# Patient Record
Sex: Male | Born: 1965 | Race: White | Hispanic: No | Marital: Married | Smoking: Never smoker
Health system: Southern US, Community
[De-identification: ages and names within clinical notes are randomized; demographics above are authoritative.]

## PROBLEM LIST (undated history)

## (undated) DIAGNOSIS — Z9889 Other specified postprocedural states: Secondary | ICD-10-CM

## (undated) DIAGNOSIS — T7840XA Allergy, unspecified, initial encounter: Secondary | ICD-10-CM

## (undated) DIAGNOSIS — F1910 Other psychoactive substance abuse, uncomplicated: Secondary | ICD-10-CM

## (undated) DIAGNOSIS — E785 Hyperlipidemia, unspecified: Secondary | ICD-10-CM

## (undated) DIAGNOSIS — R112 Nausea with vomiting, unspecified: Secondary | ICD-10-CM

## (undated) DIAGNOSIS — K219 Gastro-esophageal reflux disease without esophagitis: Secondary | ICD-10-CM

## (undated) DIAGNOSIS — E119 Type 2 diabetes mellitus without complications: Secondary | ICD-10-CM

## (undated) DIAGNOSIS — M1990 Unspecified osteoarthritis, unspecified site: Secondary | ICD-10-CM

## (undated) HISTORY — DX: Type 2 diabetes mellitus without complications: E11.9

## (undated) HISTORY — PX: HERNIA REPAIR: SHX51

## (undated) HISTORY — DX: Hyperlipidemia, unspecified: E78.5

## (undated) HISTORY — PX: POLYPECTOMY: SHX149

## (undated) HISTORY — DX: Other psychoactive substance abuse, uncomplicated: F19.10

## (undated) HISTORY — PX: TONSILLECTOMY: SUR1361

## (undated) HISTORY — PX: COLONOSCOPY: SHX174

## (undated) HISTORY — DX: Allergy, unspecified, initial encounter: T78.40XA

---

## 2007-11-12 ENCOUNTER — Emergency Department: Payer: Self-pay | Admitting: Emergency Medicine

## 2008-02-05 ENCOUNTER — Ambulatory Visit: Payer: Self-pay | Admitting: Family Medicine

## 2008-02-05 DIAGNOSIS — J4520 Mild intermittent asthma, uncomplicated: Secondary | ICD-10-CM

## 2008-02-05 DIAGNOSIS — J309 Allergic rhinitis, unspecified: Secondary | ICD-10-CM | POA: Insufficient documentation

## 2008-02-05 DIAGNOSIS — F1411 Cocaine abuse, in remission: Secondary | ICD-10-CM

## 2008-02-06 ENCOUNTER — Ambulatory Visit: Payer: Self-pay | Admitting: Family Medicine

## 2008-02-06 DIAGNOSIS — E781 Pure hyperglyceridemia: Secondary | ICD-10-CM

## 2008-02-06 DIAGNOSIS — E119 Type 2 diabetes mellitus without complications: Secondary | ICD-10-CM

## 2008-02-06 LAB — CONVERTED CEMR LAB
ALT: 17 units/L (ref 0–53)
CO2: 28 meq/L (ref 19–32)
Calcium: 9 mg/dL (ref 8.4–10.5)
Creatinine, Ser: 0.8 mg/dL (ref 0.4–1.5)
GFR calc Af Amer: 136 mL/min
Glucose, Bld: 108 mg/dL — ABNORMAL HIGH (ref 70–99)
HDL: 33.2 mg/dL — ABNORMAL LOW (ref >39.0)
Total Bilirubin: 0.9 mg/dL (ref 0.3–1.2)
Total CHOL/HDL Ratio: 5.9
Total Protein: 6.9 g/dL (ref 6.0–8.3)
Triglycerides: 185 mg/dL — ABNORMAL HIGH (ref 0–149)
VLDL: 37 mg/dL (ref 0–40)

## 2008-05-28 ENCOUNTER — Ambulatory Visit: Payer: Self-pay | Admitting: Family Medicine

## 2008-05-29 LAB — CONVERTED CEMR LAB
Cholesterol: 246 mg/dL (ref 0–200)
Triglycerides: 220 mg/dL (ref 0–149)

## 2008-09-28 ENCOUNTER — Ambulatory Visit: Payer: Self-pay | Admitting: Family Medicine

## 2008-09-29 LAB — CONVERTED CEMR LAB
Direct LDL: 127.8 mg/dL
HDL: 30.4 mg/dL — ABNORMAL LOW (ref >39.0)
Total CHOL/HDL Ratio: 8
Triglycerides: 303 mg/dL (ref 0–149)

## 2009-01-01 ENCOUNTER — Ambulatory Visit: Payer: Self-pay | Admitting: Family Medicine

## 2009-01-01 LAB — CONVERTED CEMR LAB
AST: 23 units/L (ref 0–37)
Direct LDL: 133.1 mg/dL

## 2009-09-06 ENCOUNTER — Encounter (INDEPENDENT_AMBULATORY_CARE_PROVIDER_SITE_OTHER): Payer: Self-pay | Admitting: *Deleted

## 2009-09-06 ENCOUNTER — Ambulatory Visit: Payer: Self-pay | Admitting: Family Medicine

## 2009-09-06 DIAGNOSIS — K5289 Other specified noninfective gastroenteritis and colitis: Secondary | ICD-10-CM

## 2009-09-06 DIAGNOSIS — E860 Dehydration: Secondary | ICD-10-CM

## 2010-02-25 ENCOUNTER — Telehealth (INDEPENDENT_AMBULATORY_CARE_PROVIDER_SITE_OTHER): Payer: Self-pay | Admitting: *Deleted

## 2010-02-28 ENCOUNTER — Ambulatory Visit: Payer: Self-pay | Admitting: Family Medicine

## 2010-03-04 ENCOUNTER — Ambulatory Visit: Payer: Self-pay | Admitting: Family Medicine

## 2010-03-04 LAB — CONVERTED CEMR LAB
AST: 23 units/L (ref 0–37)
Albumin: 4.2 g/dL (ref 3.5–5.2)
BUN: 15 mg/dL (ref 6–23)
Calcium: 9.5 mg/dL (ref 8.4–10.5)
Cholesterol: 262 mg/dL — ABNORMAL HIGH (ref 0–200)
Direct LDL: 81.9 mg/dL
GFR calc non Af Amer: 101.75 mL/min (ref >60)
Glucose, Bld: 115 mg/dL — ABNORMAL HIGH (ref 70–99)
HDL: 30.9 mg/dL — ABNORMAL LOW (ref >39.00)
Triglycerides: 1181 mg/dL — ABNORMAL HIGH (ref 0.0–149.0)

## 2010-05-31 ENCOUNTER — Ambulatory Visit: Payer: Self-pay | Admitting: Family Medicine

## 2010-06-05 DIAGNOSIS — R740 Nonspecific elevation of levels of transaminase and lactic acid dehydrogenase [LDH]: Secondary | ICD-10-CM

## 2010-06-05 DIAGNOSIS — R7402 Elevation of levels of lactic acid dehydrogenase (LDH): Secondary | ICD-10-CM | POA: Insufficient documentation

## 2010-06-05 LAB — CONVERTED CEMR LAB
ALT: 57 units/L — ABNORMAL HIGH (ref 0–53)
AST: 30 units/L (ref 0–37)
Albumin: 4.2 g/dL (ref 3.5–5.2)
BUN: 20 mg/dL (ref 6–23)
GFR calc non Af Amer: 69.87 mL/min (ref >60)
Glucose, Bld: 122 mg/dL — ABNORMAL HIGH (ref 70–99)
HDL: 39.7 mg/dL (ref >39.00)
PSA: 0.9 ng/mL (ref 0.10–4.00)
Potassium: 4.2 meq/L (ref 3.5–5.1)
Total Protein: 6.7 g/dL (ref 6.0–8.3)

## 2010-06-20 ENCOUNTER — Ambulatory Visit: Payer: Self-pay | Admitting: Family Medicine

## 2010-06-22 LAB — CONVERTED CEMR LAB
Albumin: 4.3 g/dL (ref 3.5–5.2)
Bilirubin, Direct: 0.1 mg/dL (ref 0.0–0.3)
Hep A IgM: NEGATIVE
Hep B C IgM: NEGATIVE
Hepatitis B Surface Ag: NEGATIVE
Total Protein: 6.8 g/dL (ref 6.0–8.3)

## 2010-06-27 ENCOUNTER — Encounter: Payer: Self-pay | Admitting: Family Medicine

## 2010-06-27 ENCOUNTER — Ambulatory Visit: Payer: Self-pay | Admitting: Family Medicine

## 2010-07-26 ENCOUNTER — Ambulatory Visit
Admission: RE | Admit: 2010-07-26 | Discharge: 2010-07-26 | Payer: Self-pay | Source: Home / Self Care | Attending: Family Medicine | Admitting: Family Medicine

## 2010-07-26 DIAGNOSIS — M79609 Pain in unspecified limb: Secondary | ICD-10-CM | POA: Insufficient documentation

## 2010-08-16 NOTE — Assessment & Plan Note (Signed)
Summary: feeling dehydrated/ alc   Vital Signs:  Patient profile:   45 year old male Height:      70 inches Weight:      181.6 pounds BMI:     26.15 Temp:     97.9 degrees F oral Pulse rate:   92 / minute Pulse rhythm:   regular BP sitting:   120 / 70  (left arm) Cuff size:   regular  Vitals Entered By: Benny Lennert CMA Duncan Dull) (September 06, 2009 4:25 PM)  History of Present Illness: Chief complaint diarrhea since thursday started feeling really weak last night  Acute Visit History:      The patient complains of abdominal pain, diarrhea, and nausea.  These symptoms began 5 days ago.  He denies constipation, cough, earache, eye symptoms, fever, genitourinary symptoms, headache, musculoskeletal symptoms, sinus problems, sore throat, and vomiting.  Other comments include: SEVERE DIARRHEA, APPROX 30 TIMES A DAY WITH APPROX 10 POUND WEIGHT LOSS. TOLERATING by mouth FLUIDS, NO VOMITTING.        Urine output has been decreased.  He is tolerating clear liquids.        Allergies (verified): No Known Drug Allergies  Past History:  Past medical, surgical, family and social histories (including risk factors) reviewed, and no changes noted (except as noted below).  Past Medical History: Reviewed history from 02/05/2008 and no changes required. Asthma, mild as a child Allergic rhinitis  Past Surgical History: Reviewed history from 02/05/2008 and no changes required. 21 day rehab program inpatient 1997 2001 Hernia, inguinal 1970 tonsillectomy  Family History: Reviewed history from 02/05/2008 and no changes required. father: prostate cancer age 5, pancreatic cancer mother:high chol, CAD stent placed age 87, bipolar do Family History of CAD Male 1st degree relative <60 brother: HTN sister: bipolar MGF: melanoma  Social History: Reviewed history from 02/05/2008 and no changes required.  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 CV: No chest pain or SOB GI: as above Otherwise, pertinent positives and negatives are noted in the HPI.   Physical Exam  Additional Exam:  GEN: WDWN, NAD, Non-toxic, A & O x 3 HEENT: Atraumatic, Normocephalic. Neck supple. No masses, No LAD. Ears and Nose: No external deformity. CV: RRR, No M/G/R. No JVD. No thrill. No extra heart sounds. PULM: CTA B, no wheezes, crackles, rhonchi. No retractions. No resp. distress. No accessory muscle use. ABD: S, NT, ND, hyper active BS. No rebound tenderness. No HSM.  EXTR: No c/c/e NEURO: Normal gait.  PSYCH: Normally interactive. Conversant. Not depressed or anxious appearing.  Calm demeanor.     Impression & Recommendations:  Problem # 1:  GASTROENTERITIS (ICD-558.9) Assessment New Please see the patient instructions for a detailed list of plans and what was discussed with the patient.   immodium, phenergan ok  Problem # 2:  DEHYDRATION (ICD-276.51) Assessment: New 10 pound weight loss, mod - severe dehydration but tolerating by mouth liquids, generally healthy male. Push fluids and manage outpatient  Complete Medication List: 1)  Promethazine Hcl 25 Mg Tabs (Promethazine hcl) .Marland Kitchen.. 1 by mouth q 6 hours as needed nausea  Patient Instructions: 1)  Prevent dehydration: drink Gatorade, Pedialyte,  Ginger Ale, popsicles 2)  Immodium A-D over ther counter or Pepto-Bismol  3)  Diet: liquids, advance slowly to bananas, rice, applesauce, grits, toast, baked potato. 4)  No milk, dairy, spicy or fried food. 5)  No alcohol, tobacco, caffeine  Prescriptions: PROMETHAZINE HCL 25 MG  TABS (PROMETHAZINE HCL) 1 by mouth q 6 hours as needed  nausea  #30 x 0   Entered and Authorized by:   Hannah Beat MD   Signed by:   Hannah Beat MD on 09/06/2009   Method used:   Print then Give to Patient   RxID:   [id number]   Prior Medications (reviewed today): None Current Allergies (reviewed today): No known allergies

## 2010-08-16 NOTE — Progress Notes (Signed)
----   Converted from flag ---- ---- 02/25/2010 1:53 PM, Kerby Nora MD wrote: Dx 272.0 CMET LIPIDs  ---- 02/25/2010 12:28 PM, Mills Koller wrote: Patient is scheduled for a CPX with you, I need lab orders with DX, Please. Thanks, Terri ------------------------------

## 2010-08-16 NOTE — Letter (Signed)
Summary: Out of Work  [facility]  [address] [place], [place] [address]   Phone: [phone]  Fax: [phone]    September 06, 2009   Employee:  Joseph Lam    To Whom It May Concern:   For Medical reasons, please excuse the above named employee from work for the following dates:  Start:September 06, 2009 4:50 PM   End:May return on Thursday September 09, 2009 may return earlier if symptoms resolved    If you need additional information, please feel free to contact our office.         Sincerely,    Hannah Beat MD

## 2010-08-16 NOTE — Assessment & Plan Note (Signed)
Summary: CPX  CYD   Vital Signs:  Patient profile:   45 year old male Height:      70 inches Weight:      193.6 pounds BMI:     27.88 Temp:     98.6 degrees F oral Pulse rate:   92 / minute Pulse rhythm:   regular BP sitting:   110 / 78  (left arm) Cuff size:   regular  Vitals Entered By: Benny Lennert CMA Duncan Dull) (March 04, 2010 11:23 AM)  History of Present Illness: Chief complaint cpx  The patient is here for annual wellness exam and preventative care.      Has noted in past few months.Marland Kitchenocc seems to not be breathing, has to take a deep breath. Noted shallower breathing.  Wife states that he stops breathing at night..snores alot...was happening more in past than now. No chest pain. Mild  fatigue.  Winded more easily. Has gained 40 lbs in last 1-2 years...weight gain has been centrally.    Seeing podiatrist for plantar fasciitis.Marland Kitchenon meloxicam. Was also initially on prednsione.  Father with prostate cancer age 2.    Problems Prior to Update: 1)  Dehydration  (ICD-276.51) 2)  Gastroenteritis  (ICD-558.9) 3)  Prediabetes  (ICD-790.29) 4)  Well Adult Exam  (ICD-V70.0) 5)  Hypertriglyceridemia  (ICD-272.1) 6)  Family History of Cad Male 1st Degree Relative <60  (ICD-V16.49) 7)  Allergic Rhinitis  (ICD-477.9) 8)  Asthma  (ICD-493.90) 9)  Cocaine Abuse, in Remission  (ICD-305.63)  Current Medications (verified): 1)  None  Allergies (verified): No Known Drug Allergies  Past History:  Past medical, surgical, family and social histories (including risk factors) reviewed, and no changes noted (except as noted below).  Past Medical History: Reviewed history from 02/05/2008 and no changes required. Asthma, mild as a child Allergic rhinitis  Past Surgical History: Reviewed history from 02/05/2008 and no changes required. 21 day rehab program inpatient 1997 2001 Hernia, inguinal 1970 tonsillectomy  Family History: Reviewed history from 02/05/2008 and no  changes required. father: prostate cancer age 3, pancreatic cancer mother:high chol, CAD stent placed age 4, bipolar do Family History of CAD Male 1st degree relative <60 brother: HTN sister: bipolar MGF: melanoma  Social History: Reviewed history from 02/05/2008 and no changes required.  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 Alcohol use-yes, 3 drinks in last year Drug use-yes, hx of cocaine abuse, nothing injected Regular exercise-yes, weekly Diet: occ fast food, skips meals, limited fruit and veggies Marital Status: Married Children:  Occupation:   Review of Systems General:  Complains of fatigue; denies fever. CV:  Denies chest pain or discomfort. Resp:  Complains of shortness of breath; denies cough, coughing up blood, sputum productive, and wheezing. GI:  Denies abdominal pain, constipation, and diarrhea. GU:  Denies dysuria and erectile dysfunction. Psych:  Denies anxiety, depression, and suicidal thoughts/plans.  Physical Exam  General:  Well-developed,well-nourished,in no acute distress; alert,appropriate and cooperative throughout examination Head:  Normocephalic and atraumatic without obvious abnormalities. No apparent alopecia or balding. Eyes:  No corneal or conjunctival inflammation noted. EOMI. Perrla. Funduscopic exam benign, without hemorrhages, exudates or papilledema. Vision grossly normal. Ears:  External ear exam shows no significant lesions or deformities.  Otoscopic examination reveals clear canals, tympanic membranes are intact bilaterally without bulging, retraction, inflammation or discharge. Hearing is grossly normal bilaterally. Nose:  External nasal examination shows no deformity or inflammation. Nasal mucosa are pink  and moist without lesions or exudates. Mouth:  Oral mucosa and oropharynx without lesions or exudates.  Teeth in good repair. Neck:  no  carotid bruit or thyromegaly no cervical or supraclavicular lymphadenopathy  Lungs:  Normal respiratory effort, chest expands symmetrically. Lungs are clear to auscultation, no crackles or wheezes. Heart:  Normal rate and regular rhythm. S1 and S2 normal without gallop, murmur, click, rub or other extra sounds. Abdomen:  Bowel sounds positive,abdomen soft and non-tender without masses, organomegaly or hernias noted. Genitalia:  Testes bilaterally descended without nodularity, tenderness or masses. No scrotal masses or lesions. No penis lesions or urethral discharge. Prostate:  Prostate gland firm and smooth, no enlargement, nodularity, tenderness, mass, asymmetry or induration. Msk:  No deformity or scoliosis noted of thoracic or lumbar spine.   Pulses:  R and L posterior tibial pulses are full and equal bilaterally  Extremities:  no edema Neurologic:  No cranial nerve deficits noted. Station and gait are normal. Plantar reflexes are down-going bilaterally. DTRs are symmetrical throughout. Sensory, motor and coordinative functions appear intact. Skin:  Intact without suspicious lesions or rashes Psych:  Cognition and judgment appear intact. Alert and cooperative with normal attention span and concentration. No apparent delusions, illusions, hallucinations   Impression & Recommendations:  Problem # 1:  Preventive Health Care (ICD-V70.0) The patient's preventative maintenance and recommended screening tests for an annual wellness exam were reviewed in full today. Brought up to date unless services declined.  Counselled on the importance of diet, exercise, and its role in overall health and mortality. The patient's FH and SH was reviewed, including their home life, tobacco status, and drug and alcohol status.     Problem # 2:  PREDIABETES (ICD-790.29) Encouraged exercise, weight loss, healthy eating habits.   Problem # 3:  HYPERTRIGLYCERIDEMIA (ICD-272.1) Start fish oil, lifestyle changes,  diet and start fenofibrate.  His updated medication list for this problem includes:    Fenofibrate 160 Mg Tabs (Fenofibrate) .Marland Kitchen... 1 tab by mouth daily  Problem # 4:  ? of SLEEP APNEA (ICD-780.57) Offered eval..Rajohn Carp not inyterested at this time.  Daytime breathing and fatigue likely due to central weight gain and lung restriction. Workon weight loss. If continuing or fatigue continuing...eval with TSH and cbc.   Problem # 5:  Family Hx of ADENOCARCINOMA, PROSTATE (ICD-185) Prostate exam nml. Will check PSA given father with prostate cancer in 54s.   Complete Medication List: 1)  Fenofibrate 160 Mg Tabs (Fenofibrate) .Marland Kitchen.. 1 tab by mouth daily  Patient Instructions: 1)  Fish oil  2000 mg divideded daily... DHA/EPA active ingredients. 2)   Start fenofibrate daily. 3)  Benechol...butter substitute. 4)  Canola oil and olive oil. 5)  Work on low fat diet, low carbohydrate diet. 6)   Stop soda, juice. 7)  Return for fasting lipids,CMET, PSA in 3 months Dx 272, v76.44 Prescriptions: FENOFIBRATE 160 MG TABS (FENOFIBRATE) 1 tab by mouth daily  #30 x 11   Entered and Authorized by:   Kerby Nora MD   Signed by:   Kerby Nora MD on 03/04/2010   Method used:   Electronically to        [id number] (retail)       [address]       [place], [place]  [address]       Ph: [phone] or [phone]       Fax: [phone]   RxID:  [id number]   Current Allergies (reviewed today): No known allergies

## 2010-08-18 NOTE — Assessment & Plan Note (Signed)
Summary: THUMB/CLE   Vital Signs:  Patient profile:   45 year old male Height:      70 inches Weight:      200.0 pounds BMI:     28.80 Temp:     97.8 degrees F oral Pulse rate:   72 / minute Pulse rhythm:   regular BP sitting:   130 / 80  (left arm) Cuff size:   regular  Vitals Entered By: Benny Lennert CMA Duncan Dull) (July 26, 2010 9:53 AM)  History of Present Illness: Chief complaint thumb pain  Over last year he has been having  right thumb pain  up along wrist. 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 Feels weakness when trying to abduct thumb. Pan wth rotation of thumb.  No swelling, no redness.  No fall, no injury.  Has not tried any OTC meds for pain.   Started back 2 day ago on meloxicam for plantar fasciitis.     Problems Prior to Update: 1)  Transaminases, Serum, Elevated  (ICD-790.4) 2)  Special Screening Malignant Neoplasm of Prostate  (ICD-V76.44) 3)  Fh of Adenocarcinoma, Prostate  (ICD-185) 4)  ? of Sleep Apnea  (ICD-780.57) 5)  Dehydration  (ICD-276.51) 6)  Gastroenteritis  (ICD-558.9) 7)  Prediabetes  (ICD-790.29) 8)  Well Adult Exam  (ICD-V70.0) 9)  Hypertriglyceridemia  (ICD-272.1) 10)  Family History of Cad Male 1st Degree Relative <60  (ICD-V16.49) 11)  Allergic Rhinitis  (ICD-477.9) 12)  Asthma  (ICD-493.90) 13)  Cocaine Abuse, in Remission  (ICD-305.63)  Current Medications (verified): 1)  Fenofibrate 160 Mg Tabs (Fenofibrate) .Marland Kitchen.. 1 Tab By Mouth Daily 2)  Meloxicam 15 Mg Tabs (Meloxicam) .... One Tablet Dialy  Allergies (verified): No Known Drug Allergies  Past History:  Past medical, surgical, family and social histories (including risk factors) reviewed, and no changes noted (except as noted below).  Past Medical History: Reviewed history from 02/05/2008 and no changes required. Asthma, mild as a child Allergic rhinitis  Past Surgical History: Reviewed history from 02/05/2008 and no changes required. 21 day  rehab program inpatient 1997 2001 Hernia, inguinal 1970 tonsillectomy  Family History: Reviewed history from 02/05/2008 and no changes required. father: prostate cancer age 48, pancreatic cancer mother:high chol, CAD stent placed age 85, bipolar do Family History of CAD Male 1st degree relative <60 brother: HTN sister: bipolar MGF: melanoma  Social History: Reviewed history from 03/04/2010 and no changes required.  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 Alcohol use-yes, 3 drinks in last year Drug use-yes, hx of cocaine abuse, nothing injected Regular exercise-yes, weekly Diet: occ fast food, skips meals, limited fruit and veggies Marital Status: Married Children:  Occupation:   Review of Systems General:  Denies fatigue. CV:  Denies chest pain or discomfort. Resp:  Denies shortness of breath.  Physical Exam  General:  Well-developed,well-nourished,in no acute distress; alert,appropriate and cooperative throughout examination Mouth:  Oral mucosa and oropharynx without lesions or exudates.  Teeth in good repair. Lungs:  Normal respiratory effort, chest expands symmetrically. Lungs are clear to auscultation, no crackles or wheezes. Heart:  Normal rate and regular rhythm. S1 and S2 normal without gallop, murmur, click, rub or other extra sounds. Msk:  ttp over right medial thumb and up wrist, mild ttp over scaphoid mildly pos finklestein test.  neg grind test of CMC joint. no swelling, no redness.   Impression & Recommendations:  Problem # 1:  THUMB PAIN, RIGHT (ICD-729.5) Most consistent with De Quervains tenosynovitis  Some pain at schaphoid as well, although no know trauma... consider films if not improving. Also if not improving consider gout eval given location...but not clearly cinsistent with this.  Start with NSAIDs, ice and stretching. ewar thumb spica splint.    Complete Medication List: 1)  Fenofibrate 160 Mg Tabs (Fenofibrate) .Marland Kitchen.. 1 tab by mouth daily 2)  Meloxicam 15 Mg Tabs (Meloxicam) .... One tablet dialy  Patient Instructions: 1)  Continue on meloxicam. 2)  Ice wrist/thumb. 3)  Gentle stretching. 4)   Wear thumb spica splint daily initially then as needed when improving.  5)  Follow up if not improvng in 2 week.   Orders Added: 1)  Est. Patient Level III [04540]    Current Allergies (reviewed today): No known allergies

## 2010-09-26 ENCOUNTER — Ambulatory Visit (INDEPENDENT_AMBULATORY_CARE_PROVIDER_SITE_OTHER): Payer: BC Managed Care – PPO | Admitting: Family Medicine

## 2010-09-26 ENCOUNTER — Encounter: Payer: Self-pay | Admitting: Family Medicine

## 2010-09-26 DIAGNOSIS — H01009 Unspecified blepharitis unspecified eye, unspecified eyelid: Secondary | ICD-10-CM | POA: Insufficient documentation

## 2010-10-04 NOTE — Assessment & Plan Note (Signed)
Summary: ?PINK EYE LEFT EYE/CLE  [employer]   Vital Signs:  Patient profile:   45 year old male Height:      70 inches Weight:      194.25 pounds BMI:     27.97 Temp:     98.6 degrees F oral Pulse rate:   84 / minute Pulse rhythm:   regular BP sitting:   120 / 86  (left arm) Cuff size:   large  Vitals Entered By: Delilah Shan CMA Duncan Dull) (September 26, 2010 2:10 PM) CC: ? pink eye, left eye   History of Present Illness: L upper eyelid symptoms.  Was sore Friday night.  Saturday AM swelling was some worse, some better yesterday and today.  No vision change.  No FCNAVD.  No discharge.  Not crusted in AM.  No prev symptoms like this.  No known FB.  W/o sx in ears, nose, throat.  No contacts,  no glasses.  Lid is slightly itchy.   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 Alcohol use-yes, 3 drinks in last year Drug use-yes, hx of cocaine abuse, nothing injected Regular exercise-yes, weekly Diet: occ fast food, skips meals, limited fruit and veggies Marital Status: Married Children:  Occupation:   Review of Systems       See HPI.  Otherwise negative.    Physical Exam  General:  no apparent distress normocephalic atraumatic except for some edema on L upper eyelid mucous membranes moist tm wnl nasal exam w/o erthema perrl, eomi, fundus wnl x2 conjunctiva wnl bilaterally and all lids wnl except for L upper which as diffuse erythema but isn't tender to palpation and has no fluctuance.  no mass, no FB.     Impression & Recommendations:  Problem # 1:  BLEPHARITIS, LEFT (ICD-373.00) likely irritant source of blepharitis with subsequent local injection and itching.  should resolve on its own.  follow up as needed.  This doesn't appear to be infectious, ie pinkeye.  d/w patient.   Complete Medication List: 1)  Fenofibrate 160 Mg Tabs  (Fenofibrate) .Marland Kitchen.. 1 tab by mouth daily 2)  Meloxicam 15 Mg Tabs (Meloxicam) .... One tablet dialy  Patient Instructions: 1)  I think you have and irritated (but not infected) eyelid.  This should gradually resolve on its own, but claritin may help some in the meantime with the itching.  If you have any pain, spreading redness, or vision change then let us know.  Take care.   Orders Added: 1)  Est. Patient Level III [04540]    Current Allergies (reviewed today): No known allergies

## 2010-12-06 ENCOUNTER — Other Ambulatory Visit: Payer: Self-pay

## 2010-12-06 ENCOUNTER — Other Ambulatory Visit: Payer: Self-pay | Admitting: Family Medicine

## 2010-12-06 DIAGNOSIS — E7800 Pure hypercholesterolemia, unspecified: Secondary | ICD-10-CM

## 2010-12-13 ENCOUNTER — Other Ambulatory Visit: Payer: Self-pay

## 2011-03-22 ENCOUNTER — Other Ambulatory Visit: Payer: Self-pay | Admitting: Family Medicine

## 2012-04-01 ENCOUNTER — Other Ambulatory Visit (INDEPENDENT_AMBULATORY_CARE_PROVIDER_SITE_OTHER): Payer: BC Managed Care – PPO

## 2012-04-01 DIAGNOSIS — R7309 Other abnormal glucose: Secondary | ICD-10-CM

## 2012-04-01 DIAGNOSIS — Z125 Encounter for screening for malignant neoplasm of prostate: Secondary | ICD-10-CM

## 2012-04-01 DIAGNOSIS — R748 Abnormal levels of other serum enzymes: Secondary | ICD-10-CM

## 2012-04-01 DIAGNOSIS — R7303 Prediabetes: Secondary | ICD-10-CM

## 2012-04-01 DIAGNOSIS — E781 Pure hyperglyceridemia: Secondary | ICD-10-CM

## 2012-04-01 LAB — LIPID PANEL
Cholesterol: 233 mg/dL — ABNORMAL HIGH (ref 0–200)
HDL: 33.3 mg/dL — ABNORMAL LOW (ref >39.00)
Total CHOL/HDL Ratio: 7
VLDL: 88.6 mg/dL — ABNORMAL HIGH (ref 0.0–40.0)

## 2012-04-01 LAB — COMPREHENSIVE METABOLIC PANEL
ALT: 59 U/L — ABNORMAL HIGH (ref 0–53)
AST: 27 U/L (ref 0–37)
Albumin: 4.4 g/dL (ref 3.5–5.2)
CO2: 27 mEq/L (ref 19–32)
Calcium: 9.7 mg/dL (ref 8.4–10.5)
Chloride: 104 mEq/L (ref 96–112)
GFR: 83.76 mL/min (ref >60.00)
Potassium: 4.2 mEq/L (ref 3.5–5.1)

## 2012-04-01 LAB — CBC WITH DIFFERENTIAL/PLATELET
Basophils Absolute: 0.1 10*3/uL (ref 0.0–0.1)
Eosinophils Absolute: 0.3 10*3/uL (ref 0.0–0.7)
HCT: 41.1 % (ref 39.0–52.0)
Lymphs Abs: 4.4 10*3/uL — ABNORMAL HIGH (ref 0.7–4.0)
MCHC: 32.9 g/dL (ref 30.0–36.0)
MCV: 86.8 fl (ref 78.0–100.0)
Monocytes Absolute: 0.7 10*3/uL (ref 0.1–1.0)
Platelets: 285 10*3/uL (ref 150.0–400.0)
RDW: 13.4 % (ref 11.5–14.6)

## 2012-04-01 LAB — HEMOGLOBIN A1C: Hgb A1c MFr Bld: 7.7 % — ABNORMAL HIGH (ref 4.6–6.5)

## 2012-04-01 LAB — LDL CHOLESTEROL, DIRECT: Direct LDL: 122.6 mg/dL

## 2012-04-12 ENCOUNTER — Encounter: Payer: Self-pay | Admitting: Family Medicine

## 2012-04-12 ENCOUNTER — Ambulatory Visit (INDEPENDENT_AMBULATORY_CARE_PROVIDER_SITE_OTHER): Payer: BC Managed Care – PPO | Admitting: Family Medicine

## 2012-04-12 VITALS — BP 122/84 | HR 79 | Temp 98.4°F | Ht 70.5 in | Wt 208.8 lb

## 2012-04-12 DIAGNOSIS — Z0000 Encounter for general adult medical examination without abnormal findings: Secondary | ICD-10-CM

## 2012-04-12 DIAGNOSIS — E119 Type 2 diabetes mellitus without complications: Secondary | ICD-10-CM

## 2012-04-12 DIAGNOSIS — Z23 Encounter for immunization: Secondary | ICD-10-CM

## 2012-04-12 DIAGNOSIS — E781 Pure hyperglyceridemia: Secondary | ICD-10-CM

## 2012-04-12 DIAGNOSIS — K219 Gastro-esophageal reflux disease without esophagitis: Secondary | ICD-10-CM

## 2012-04-12 NOTE — Assessment & Plan Note (Signed)
Poor control. Take fenofibrate regulaly. Add back fish oil. The patient is advised to begin progressive daily aerobic exercise program, follow a low fat, low cholesterol diet and attempt to lose weight.  Recheck in 3 months.

## 2012-04-12 NOTE — Progress Notes (Signed)
Subjective:    Patient ID: Joseph Lam, male    DOB: 02-15-1966, 46 y.o.   MRN: [id number]  HPI  The patient is here for annual wellness exam and preventative care.    Hypertriglyceridemia:Has been as high as 1180 in past.  He has not been taking this regualrly. Lab Results  Component Value Date   CHOL 233* 04/01/2012   HDL 33.30* 04/01/2012   LDLCALC 127* 02/06/2008   LDLDIRECT 122.6 04/01/2012   TRIG 443.0 Triglyceride is over 400; calculations on Lipids are invalid.* 04/01/2012   CHOLHDL 7 04/01/2012    Wt Readings from Last 3 Encounters:  04/12/12 208 lb 12 oz (94.688 kg)  09/26/10 194 lb 4 oz (88.111 kg)  07/26/10 200 lb (90.719 kg)    Diet: night-time eating, portion control good, 2 cans soda a week, mostly water  Exercise: None  GERD controlled with prilosec 20 mg daily.    Review of Systems  Constitutional: Positive for fatigue. Negative for fever and unexpected weight change.  HENT: Negative for ear pain, congestion, sore throat, rhinorrhea, trouble swallowing and postnasal drip.   Eyes: Negative for pain.  Respiratory: Negative for cough, shortness of breath and wheezing.   Cardiovascular: Negative for chest pain, palpitations and leg swelling.  Gastrointestinal: Negative for nausea, abdominal pain, diarrhea, constipation and blood in stool.  Genitourinary: Negative for dysuria, urgency, hematuria, discharge, penile swelling, scrotal swelling, difficulty urinating, penile pain and testicular pain.  Skin: Negative for rash.  Neurological: Negative for syncope, weakness, light-headedness, numbness and headaches.  Psychiatric/Behavioral: Positive for disturbed wake/sleep cycle. Negative for behavioral problems and dysphoric mood. The patient is not nervous/anxious.        Does not have time to sleep.       Objective:   Physical Exam  Constitutional: He appears well-developed and well-nourished.  Non-toxic appearance. He does not appear ill. No distress.  HENT:    Head: Normocephalic and atraumatic.  Right Ear: Hearing, tympanic membrane, external ear and ear canal normal.  Left Ear: Hearing, tympanic membrane, external ear and ear canal normal.  Nose: Nose normal.  Mouth/Throat: Uvula is midline, oropharynx is clear and moist and mucous membranes are normal.  Eyes: Conjunctivae normal, EOM and lids are normal. Pupils are equal, round, and reactive to light. No foreign bodies found.  Neck: Trachea normal, normal range of motion and phonation normal. Neck supple. Carotid bruit is not present. No mass and no thyromegaly present.  Cardiovascular: Normal rate, regular rhythm, S1 normal, S2 normal, intact distal pulses and normal pulses.  Exam reveals no gallop.   No murmur heard. Pulmonary/Chest: Breath sounds normal. He has no wheezes. He has no rhonchi. He has no rales.  Abdominal: Soft. Normal appearance and bowel sounds are normal. There is no hepatosplenomegaly. There is no tenderness. There is no rebound, no guarding and no CVA tenderness. No hernia. Hernia confirmed negative in the right inguinal area and confirmed negative in the left inguinal area.  Genitourinary: Prostate normal, testes normal and penis normal. Rectal exam shows no external hemorrhoid, no internal hemorrhoid, no fissure, no mass, no tenderness and anal tone normal. Guaiac negative stool. Prostate is not enlarged and not tender. Right testis shows no mass and no tenderness. Left testis shows no mass and no tenderness. No paraphimosis or penile tenderness.  Lymphadenopathy:    He has no cervical adenopathy.       Right: No inguinal adenopathy present.       Left: No inguinal adenopathy present.  Neurological: He is alert. He has normal strength and normal reflexes. No cranial nerve deficit or sensory deficit. Gait normal.  Skin: Skin is warm, dry and intact. No rash noted.  Psychiatric: He has a normal mood and affect. His speech is normal and behavior is normal. Judgment normal.    Diabetic foot exam: Normal inspection No skin breakdown No calluses  Normal DP pulses Normal sensation to light touch and monofilament Nails normal         Assessment & Plan:  The patient's preventative maintenance and recommended screening tests for an annual wellness exam were reviewed in full today. Brought up to date unless services declined.  Counselled on the importance of diet, exercise, and its role in overall health and mortality. The patient's FH and SH was reviewed, including their home life, tobacco status, and drug and alcohol status.   Prstate cancer Dad age 36.  Lab Results  Component Value Date   PSA 0.89 04/01/2012   PSA 0.90 05/31/2010   Vaccines: refused PNA and Flu.  Open to Tdap.

## 2012-04-12 NOTE — Assessment & Plan Note (Signed)
Stable control on prilosec 20 mg daily.

## 2012-04-12 NOTE — Assessment & Plan Note (Signed)
Stable, likely due to fatty liver.

## 2012-04-12 NOTE — Patient Instructions (Addendum)
Start taking fenofibrate regularly.  Fish oil 2000 mg daily. Start baby aspirin 81 mg daily. Work on exercise, low fat and low carb. Stop by the front desk to set up nutrition referral. Follow up in 3 months with fasting labs prior for DM check. Consider PNA vaccines.

## 2012-04-12 NOTE — Assessment & Plan Note (Signed)
New diagnosis. Will hold on med at this point despite A1C above 7 as pt wants to try aggressive lifestyle measures. Total visit time 30 minutes, > 50% spent counseling and cordinating patients care. Info given. Referral made to nutritionist. The patient is asked to make an attempt to improve diet and exercise patterns to aid in medical management of this problem.  Follow up in 3 months.

## 2012-04-29 ENCOUNTER — Ambulatory Visit: Payer: Self-pay | Admitting: Family Medicine

## 2012-05-01 ENCOUNTER — Other Ambulatory Visit: Payer: Self-pay | Admitting: *Deleted

## 2012-05-01 NOTE — Telephone Encounter (Signed)
Patient called stating that he went to [facility] for diabetic teaching. He was given a Ultra one touch machine to check his blood sugar twice a day. Patient needs scripts sent to pharmacy for test strips and lancets. Added to med list. Patient is aware that Dr. Ermalene Searing is out until Thursday.  Pharmacy [facility]

## 2012-05-02 MED ORDER — MICROLET LANCETS MISC: Status: DC

## 2012-05-02 MED ORDER — GLUCOSE BLOOD VI STRP: ORAL_STRIP | Status: DC

## 2012-05-02 NOTE — Telephone Encounter (Signed)
Test strips and lancets request sent to [facility] electronically.Patient notified as instructed by telephone.

## 2012-05-02 NOTE — Telephone Encounter (Signed)
Please fill ask requested for 1 year checking 1-2 times a day

## 2012-05-17 ENCOUNTER — Ambulatory Visit: Payer: Self-pay | Admitting: Family Medicine

## 2012-05-29 ENCOUNTER — Other Ambulatory Visit: Payer: Self-pay | Admitting: Family Medicine

## 2012-07-12 ENCOUNTER — Other Ambulatory Visit: Payer: BC Managed Care – PPO

## 2012-07-16 ENCOUNTER — Ambulatory Visit: Payer: BC Managed Care – PPO | Admitting: Family Medicine

## 2012-07-26 ENCOUNTER — Other Ambulatory Visit (INDEPENDENT_AMBULATORY_CARE_PROVIDER_SITE_OTHER): Payer: BC Managed Care – PPO

## 2012-07-26 DIAGNOSIS — E119 Type 2 diabetes mellitus without complications: Secondary | ICD-10-CM

## 2012-07-26 LAB — LIPID PANEL
HDL: 30.8 mg/dL — ABNORMAL LOW (ref >39.00)
Triglycerides: 217 mg/dL — ABNORMAL HIGH (ref 0.0–149.0)
VLDL: 43.4 mg/dL — ABNORMAL HIGH (ref 0.0–40.0)

## 2012-07-26 LAB — COMPREHENSIVE METABOLIC PANEL
AST: 25 U/L (ref 0–37)
Albumin: 4.1 g/dL (ref 3.5–5.2)
BUN: 24 mg/dL — ABNORMAL HIGH (ref 6–23)
Calcium: 9 mg/dL (ref 8.4–10.5)
Chloride: 104 mEq/L (ref 96–112)
Creatinine, Ser: 1.2 mg/dL (ref 0.4–1.5)
Glucose, Bld: 145 mg/dL — ABNORMAL HIGH (ref 70–99)
Potassium: 3.9 mEq/L (ref 3.5–5.1)

## 2012-07-26 LAB — MICROALBUMIN / CREATININE URINE RATIO: Creatinine,U: 306.1 mg/dL

## 2012-07-26 LAB — HEMOGLOBIN A1C: Hgb A1c MFr Bld: 6.9 % — ABNORMAL HIGH (ref 4.6–6.5)

## 2012-07-26 NOTE — Addendum Note (Signed)
Addended by: Alvina Chou on: 07/26/2012 09:30 AM   Modules accepted: Orders

## 2012-08-02 ENCOUNTER — Encounter: Payer: Self-pay | Admitting: Family Medicine

## 2012-08-02 ENCOUNTER — Ambulatory Visit (INDEPENDENT_AMBULATORY_CARE_PROVIDER_SITE_OTHER): Payer: BC Managed Care – PPO | Admitting: Family Medicine

## 2012-08-02 VITALS — BP 120/84 | HR 77 | Temp 98.3°F | Ht 70.5 in | Wt 200.8 lb

## 2012-08-02 DIAGNOSIS — R7401 Elevation of levels of liver transaminase levels: Secondary | ICD-10-CM

## 2012-08-02 DIAGNOSIS — E781 Pure hyperglyceridemia: Secondary | ICD-10-CM

## 2012-08-02 DIAGNOSIS — E119 Type 2 diabetes mellitus without complications: Secondary | ICD-10-CM

## 2012-08-02 LAB — HM DIABETES FOOT EXAM

## 2012-08-02 MED ORDER — ATORVASTATIN CALCIUM 40 MG PO TABS: 40.0000 mg | ORAL_TABLET | Freq: Every day | ORAL | Status: DC

## 2012-08-02 NOTE — Assessment & Plan Note (Signed)
Improved control with lifestyle changes. Pt congratulated.  Continue.

## 2012-08-02 NOTE — Patient Instructions (Addendum)
Stop fenofibrate. Start atorvastatin daily. Continue working on low carb low fat heart healthy diet Continue regular exercise. Follow up in 3 month with fasting labs prior. Have eyes examined.

## 2012-08-02 NOTE — Assessment & Plan Note (Signed)
Improved with weight loss 

## 2012-08-02 NOTE — Progress Notes (Signed)
  Subjective:    Patient ID: Joseph Lam, male    DOB: 1966/03/18, 47 y.o.   MRN: [id number]  HPI Diabetes:  Improved with lifestyle changes in last 3-4 months! Lab Results  Component Value Date   HGBA1C 6.9* 07/26/2012  Feet problems: None Blood Sugars averaging: eye exam within last year: BP borderline .Marland Kitchen Goal <130/80  Elevated Cholesterol:  LDL not at goal <100 on fenofibrate, trig remain high ut are much better down from 400 Lab Results  Component Value Date   CHOL 199 07/26/2012   HDL 30.80* 07/26/2012   LDLCALC 127* 02/06/2008   LDLDIRECT 122.2 07/26/2012   TRIG 217.0* 07/26/2012   CHOLHDL 6 07/26/2012   Liver function tests have resolved in last 3 months as well! Using medications without problems:None Muscle aches: None Diet compliance:improving, has cut out soda, drinking a lot of water, decreased carbs. Exercise:Moderate Other complaints:   8 lb weight loss Wt Readings from Last 3 Encounters:  08/02/12 200 lb 12 oz (91.06 kg)  04/12/12 208 lb 12 oz (94.688 kg)  09/26/10 194 lb 4 oz (88.111 kg)       Review of Systems  Constitutional: Negative for fever and fatigue.  HENT: Negative for ear pain.   Eyes: Negative for pain.  Respiratory: Negative for shortness of breath.   Cardiovascular: Negative for chest pain.  Gastrointestinal: Negative for abdominal distention.       Objective:   Physical Exam  Constitutional: Vital signs are normal. He appears well-developed and well-nourished.  HENT:  Head: Normocephalic.  Right Ear: Hearing normal.  Left Ear: Hearing normal.  Nose: Nose normal.  Mouth/Throat: Oropharynx is clear and moist and mucous membranes are normal.  Neck: Trachea normal. Carotid bruit is not present. No mass and no thyromegaly present.  Cardiovascular: Normal rate, regular rhythm and normal pulses.  Exam reveals no gallop, no distant heart sounds and no friction rub.   No murmur heard.      No peripheral edema  Pulmonary/Chest: Effort normal  and breath sounds normal. No respiratory distress.  Skin: Skin is warm, dry and intact. No rash noted.  Psychiatric: He has a normal mood and affect. His speech is normal and behavior is normal. Thought content normal.    Diabetic foot exam: Normal inspection No skin breakdown No calluses  Normal DP pulses Normal sensation to light touch and monofilament Nails normal         Assessment & Plan:

## 2012-08-02 NOTE — Assessment & Plan Note (Signed)
Trig improved but not yet at goal. LDL also not at goal.. Change to atorvastatin. Recheck in 3 months.

## 2012-10-29 ENCOUNTER — Other Ambulatory Visit: Payer: BC Managed Care – PPO

## 2012-11-04 ENCOUNTER — Other Ambulatory Visit: Payer: BC Managed Care – PPO

## 2012-11-05 ENCOUNTER — Ambulatory Visit: Payer: BC Managed Care – PPO | Admitting: Family Medicine

## 2012-11-05 ENCOUNTER — Other Ambulatory Visit (INDEPENDENT_AMBULATORY_CARE_PROVIDER_SITE_OTHER): Payer: BC Managed Care – PPO

## 2012-11-05 DIAGNOSIS — E781 Pure hyperglyceridemia: Secondary | ICD-10-CM

## 2012-11-05 DIAGNOSIS — E119 Type 2 diabetes mellitus without complications: Secondary | ICD-10-CM

## 2012-11-05 LAB — COMPREHENSIVE METABOLIC PANEL
ALT: 50 U/L (ref 0–53)
Albumin: 4.3 g/dL (ref 3.5–5.2)
CO2: 28 mEq/L (ref 19–32)
Calcium: 8.6 mg/dL (ref 8.4–10.5)
Chloride: 104 mEq/L (ref 96–112)
GFR: 88.59 mL/min (ref >60.00)
Glucose, Bld: 142 mg/dL — ABNORMAL HIGH (ref 70–99)
Potassium: 4 mEq/L (ref 3.5–5.1)
Sodium: 136 mEq/L (ref 135–145)
Total Bilirubin: 1.1 mg/dL (ref 0.3–1.2)
Total Protein: 7.1 g/dL (ref 6.0–8.3)

## 2012-11-05 LAB — LIPID PANEL
Cholesterol: 163 mg/dL (ref 0–200)
VLDL: 62.6 mg/dL — ABNORMAL HIGH (ref 0.0–40.0)

## 2012-11-05 LAB — LDL CHOLESTEROL, DIRECT: Direct LDL: 79.4 mg/dL

## 2012-11-07 ENCOUNTER — Ambulatory Visit (INDEPENDENT_AMBULATORY_CARE_PROVIDER_SITE_OTHER): Payer: BC Managed Care – PPO | Admitting: Family Medicine

## 2012-11-07 ENCOUNTER — Encounter: Payer: Self-pay | Admitting: Family Medicine

## 2012-11-07 VITALS — BP 130/84 | HR 85 | Temp 97.7°F | Ht 70.5 in | Wt 209.5 lb

## 2012-11-07 DIAGNOSIS — E1169 Type 2 diabetes mellitus with other specified complication: Secondary | ICD-10-CM | POA: Insufficient documentation

## 2012-11-07 DIAGNOSIS — E785 Hyperlipidemia, unspecified: Secondary | ICD-10-CM

## 2012-11-07 DIAGNOSIS — E119 Type 2 diabetes mellitus without complications: Secondary | ICD-10-CM

## 2012-11-07 DIAGNOSIS — L259 Unspecified contact dermatitis, unspecified cause: Secondary | ICD-10-CM | POA: Insufficient documentation

## 2012-11-07 MED ORDER — TRIAMCINOLONE ACETONIDE 0.5 % EX CREA: TOPICAL_CREAM | Freq: Two times a day (BID) | CUTANEOUS | Status: DC

## 2012-11-07 NOTE — Patient Instructions (Signed)
Get back on track with healthy eating , wight loss and exercise.  Follow up  DM with labs prior in 3 months.

## 2012-11-07 NOTE — Addendum Note (Signed)
Addended by: Consuello Masse on: 11/07/2012 10:42 AM   Modules accepted: Orders

## 2012-11-07 NOTE — Assessment & Plan Note (Signed)
Worsened control with decline of diet... Get back on track, recheck in 3 months.

## 2012-11-07 NOTE — Progress Notes (Signed)
Diabetes: Was better 3 months ago... Now increased above goal. Lab Results  Component Value Date   HGBA1C 7.4* 11/05/2012   Feet problems: None  Blood Sugars averaging:  Not regularly. eye exam within last year:  Due BP borderline .Marland Kitchen Goal <130/80   Elevated Cholesterol: LDL now at goal <100 on fenofibrate and with NEW START lipitor, trig remain high Lab Results  Component Value Date   CHOL 163 11/05/2012   HDL 32.60* 11/05/2012   LDLCALC 127* 02/06/2008   LDLDIRECT 79.4 11/05/2012   TRIG 313.0* 11/05/2012   CHOLHDL 5 11/05/2012  Liver function tests have resolved. Using medications without problems:None  Muscle aches: None  Diet compliance:Not as good as ast last OV.  Exercise:Moderate  Other complaints:    Has gained weight back.. He has not been exercsing as well, diet not as good. Wt Readings from Last 3 Encounters:  11/07/12 209 lb 8 oz (95.029 kg)  08/02/12 200 lb 12 oz (91.06 kg)  04/12/12 208 lb 12 oz (94.688 kg)    Has noted a rash for months on  Itchy. Red flaky. No blisters or pustules.  Review of Systems  Constitutional: Negative for fever and fatigue.  HENT: Negative for ear pain.  Eyes: Negative for pain.  Respiratory: Negative for shortness of breath.  Cardiovascular: Negative for chest pain.  Gastrointestinal: Negative for abdominal distention.  Objective:   Physical Exam  Constitutional: Vital signs are normal. He appears well-developed and well-nourished.  HENT:  Head: Normocephalic.  Right Ear: Hearing normal.  Left Ear: Hearing normal.  Nose: Nose normal.  Mouth/Throat: Oropharynx is clear and moist and mucous membranes are normal.  Neck: Trachea normal. Carotid bruit is not present. No mass and no thyromegaly present.  Cardiovascular: Normal rate, regular rhythm and normal pulses. Exam reveals no gallop, no distant heart sounds and no friction rub.  No murmur heard. No peripheral edema  Pulmonary/Chest: Effort normal and breath sounds normal. No  respiratory distress.  Skin: Skin is warm, dry and intact. Erythematous macule at mid waist, flaky Psychiatric: He has a normal mood and affect. His speech is normal and behavior is normal. Thought content normal.  Diabetic foot exam:  Normal inspection  No skin breakdown  No calluses  Normal DP pulses  Normal sensation to light touch and monofilament  Nails normal

## 2012-11-07 NOTE — Assessment & Plan Note (Signed)
?   From belt buckle or other irritant. Treat with topical steroid x 2 weeks.

## 2012-11-07 NOTE — Assessment & Plan Note (Signed)
LDL now at goal on atorvastatin, but trig still high. Work on lifestyle change... If not at goal at next OV consider adding back fenofibrate.

## 2013-01-31 ENCOUNTER — Other Ambulatory Visit: Payer: BC Managed Care – PPO

## 2013-02-07 ENCOUNTER — Ambulatory Visit: Payer: BC Managed Care – PPO | Admitting: Family Medicine

## 2013-02-11 ENCOUNTER — Other Ambulatory Visit (INDEPENDENT_AMBULATORY_CARE_PROVIDER_SITE_OTHER): Payer: BC Managed Care – PPO

## 2013-02-11 DIAGNOSIS — E785 Hyperlipidemia, unspecified: Secondary | ICD-10-CM

## 2013-02-11 DIAGNOSIS — E119 Type 2 diabetes mellitus without complications: Secondary | ICD-10-CM

## 2013-02-11 LAB — COMPREHENSIVE METABOLIC PANEL
ALT: 48 U/L (ref 0–53)
CO2: 27 mEq/L (ref 19–32)
Calcium: 9.1 mg/dL (ref 8.4–10.5)
Chloride: 106 mEq/L (ref 96–112)
Creatinine, Ser: 0.9 mg/dL (ref 0.4–1.5)
GFR: 91.79 mL/min (ref >60.00)
Glucose, Bld: 143 mg/dL — ABNORMAL HIGH (ref 70–99)
Total Bilirubin: 1.1 mg/dL (ref 0.3–1.2)
Total Protein: 7.2 g/dL (ref 6.0–8.3)

## 2013-02-11 LAB — LIPID PANEL
HDL: 34.8 mg/dL — ABNORMAL LOW (ref >39.00)
Triglycerides: 321 mg/dL — ABNORMAL HIGH (ref 0.0–149.0)

## 2013-02-11 LAB — HEMOGLOBIN A1C: Hgb A1c MFr Bld: 7.8 % — ABNORMAL HIGH (ref 4.6–6.5)

## 2013-02-11 LAB — LDL CHOLESTEROL, DIRECT: Direct LDL: 86.6 mg/dL

## 2013-02-18 ENCOUNTER — Ambulatory Visit: Payer: BC Managed Care – PPO | Admitting: Family Medicine

## 2013-02-19 ENCOUNTER — Encounter: Payer: Self-pay | Admitting: Family Medicine

## 2013-02-19 ENCOUNTER — Ambulatory Visit (INDEPENDENT_AMBULATORY_CARE_PROVIDER_SITE_OTHER): Payer: BC Managed Care – PPO | Admitting: Family Medicine

## 2013-02-19 VITALS — BP 130/94 | HR 76 | Temp 98.1°F | Ht 70.5 in | Wt 208.0 lb

## 2013-02-19 DIAGNOSIS — E119 Type 2 diabetes mellitus without complications: Secondary | ICD-10-CM

## 2013-02-19 DIAGNOSIS — R030 Elevated blood-pressure reading, without diagnosis of hypertension: Secondary | ICD-10-CM | POA: Insufficient documentation

## 2013-02-19 DIAGNOSIS — E785 Hyperlipidemia, unspecified: Secondary | ICD-10-CM

## 2013-02-19 MED ORDER — METFORMIN HCL ER 500 MG PO TB24: 500.0000 mg | ORAL_TABLET | Freq: Every day | ORAL | Status: DC

## 2013-02-19 NOTE — Progress Notes (Signed)
47 year old male presents for follow up.  Diabetes: Worsened control on no medication. Lab Results  Component Value Date   HGBA1C 7.8* 02/11/2013  Feet problems: None  Blood Sugars averaging: Not regularly.  eye exam within last year: Due  BP borderline .Marland Kitchen Goal <130/80   Elevated Cholesterol: LDL now at goal <100 on atorvastatin , trigs still high. Lab Results  Component Value Date   CHOL 174 02/11/2013   HDL 34.80* 02/11/2013   LDLCALC 127* 02/06/2008   LDLDIRECT 86.6 02/11/2013   TRIG 321.0* 02/11/2013   CHOLHDL 5 02/11/2013    Liver function tests have resolved.  Using medications without problems:None  Muscle aches: None  Diet compliance he has not been working very hard on this  Exercise: None Other complaints:  Wt Readings from Last 3 Encounters:  02/19/13 208 lb (94.348 kg)  11/07/12 209 lb 8 oz (95.029 kg)  08/02/12 200 lb 12 oz (91.06 kg)    Review of Systems  Constitutional: Negative for fever and fatigue.  HENT: Negative for ear pain.  Eyes: Negative for pain.  Respiratory: Negative for shortness of breath.  Cardiovascular: Negative for chest pain.  Gastrointestinal: Negative for abdominal distention.  Objective:   Physical Exam  Constitutional: Vital signs are normal. He appears well-developed and well-nourished.  HENT:  Head: Normocephalic.  Right Ear: Hearing normal.  Left Ear: Hearing normal.  Nose: Nose normal.  Mouth/Throat: Oropharynx is clear and moist and mucous membranes are normal.  Neck: Trachea normal. Carotid bruit is not present. No mass and no thyromegaly present.  Cardiovascular: Normal rate, regular rhythm and normal pulses. Exam reveals no gallop, no distant heart sounds and no friction rub.  No murmur heard. No peripheral edema  Pulmonary/Chest: Effort normal and breath sounds normal. No respiratory distress.  Skin: Skin is warm, dry and intact. Erythematous macule at mid waist, flaky Psychiatric: He has a normal mood and affect. His  speech is normal and behavior is normal. Thought content normal.  Diabetic foot exam:  Normal inspection  No skin breakdown  No calluses  Normal DP pulses  Normal sensation to light touch and monofilament  Nails normal

## 2013-02-19 NOTE — Assessment & Plan Note (Signed)
LDL at goal but trigs remain high. Recommend lifestyle cahnge... If not improving will add fenofibrate back.

## 2013-02-19 NOTE — Patient Instructions (Addendum)
Follow BP at home in next 1-2 week.. Call with measurements.Goal < 130/80.  Continue working on low carb diet and start back exercise.  Start metformin daily. Follow up in 3 months with labs prior fasting. Get yearly eye exam.

## 2013-02-19 NOTE — Assessment & Plan Note (Signed)
Follow at home. If remains not at goal <130/80 consider starting lisinopril.

## 2013-02-19 NOTE — Assessment & Plan Note (Signed)
Inadequate control. Start metformin daily.

## 2013-08-17 ENCOUNTER — Other Ambulatory Visit: Payer: Self-pay | Admitting: Family Medicine

## 2013-08-25 ENCOUNTER — Other Ambulatory Visit: Payer: BC Managed Care – PPO

## 2013-08-27 ENCOUNTER — Other Ambulatory Visit (INDEPENDENT_AMBULATORY_CARE_PROVIDER_SITE_OTHER): Payer: BC Managed Care – PPO

## 2013-08-27 DIAGNOSIS — E785 Hyperlipidemia, unspecified: Secondary | ICD-10-CM

## 2013-08-27 DIAGNOSIS — E119 Type 2 diabetes mellitus without complications: Secondary | ICD-10-CM

## 2013-08-27 LAB — COMPREHENSIVE METABOLIC PANEL
ALBUMIN: 4.1 g/dL (ref 3.5–5.2)
ALK PHOS: 50 U/L (ref 39–117)
ALT: 53 U/L (ref 0–53)
AST: 24 U/L (ref 0–37)
BUN: 12 mg/dL (ref 6–23)
CO2: 26 mEq/L (ref 19–32)
Calcium: 9.4 mg/dL (ref 8.4–10.5)
Chloride: 105 mEq/L (ref 96–112)
Creatinine, Ser: 0.8 mg/dL (ref 0.4–1.5)
GFR: 103 mL/min (ref >60.00)
GLUCOSE: 157 mg/dL — AB (ref 70–99)
POTASSIUM: 4.5 meq/L (ref 3.5–5.1)
Sodium: 140 mEq/L (ref 135–145)
Total Bilirubin: 1.3 mg/dL — ABNORMAL HIGH (ref 0.3–1.2)
Total Protein: 6.9 g/dL (ref 6.0–8.3)

## 2013-08-27 LAB — LIPID PANEL
CHOLESTEROL: 158 mg/dL (ref 0–200)
HDL: 35.5 mg/dL — AB (ref >39.00)
TRIGLYCERIDES: 298 mg/dL — AB (ref 0.0–149.0)
Total CHOL/HDL Ratio: 4
VLDL: 59.6 mg/dL — AB (ref 0.0–40.0)

## 2013-08-27 LAB — LDL CHOLESTEROL, DIRECT: LDL DIRECT: 76.1 mg/dL

## 2013-08-27 LAB — HEMOGLOBIN A1C: Hgb A1c MFr Bld: 8.4 % — ABNORMAL HIGH (ref 4.6–6.5)

## 2013-08-29 ENCOUNTER — Encounter: Payer: Self-pay | Admitting: Family Medicine

## 2013-08-29 ENCOUNTER — Ambulatory Visit (INDEPENDENT_AMBULATORY_CARE_PROVIDER_SITE_OTHER): Payer: BC Managed Care – PPO | Admitting: Family Medicine

## 2013-08-29 VITALS — BP 120/88 | HR 83 | Temp 98.2°F | Ht 70.5 in | Wt 206.2 lb

## 2013-08-29 DIAGNOSIS — E119 Type 2 diabetes mellitus without complications: Secondary | ICD-10-CM

## 2013-08-29 DIAGNOSIS — E1165 Type 2 diabetes mellitus with hyperglycemia: Secondary | ICD-10-CM

## 2013-08-29 DIAGNOSIS — R030 Elevated blood-pressure reading, without diagnosis of hypertension: Secondary | ICD-10-CM

## 2013-08-29 DIAGNOSIS — E785 Hyperlipidemia, unspecified: Secondary | ICD-10-CM

## 2013-08-29 LAB — HM DIABETES FOOT EXAM

## 2013-08-29 MED ORDER — METFORMIN HCL ER (MOD) 1000 MG PO TB24: 1000.0000 mg | ORAL_TABLET | Freq: Every day | ORAL | Status: DC

## 2013-08-29 NOTE — Progress Notes (Signed)
Pre-visit discussion using our clinic review tool. No additional management support is needed unless otherwise documented below in the visit note.  

## 2013-08-29 NOTE — Assessment & Plan Note (Signed)
BP is improved on no med. Continue top follow as her works on lifestyle changes.

## 2013-08-29 NOTE — Progress Notes (Signed)
48 year old male presents for 3 month follow up, overdue.  Diabetes: Worsened control despite initiation of metformin, only on metformin XR  500 mg daily. NO SE to medication. Lab Results  Component Value Date   HGBA1C 8.4* 08/27/2013  Feet problems: None  Blood Sugars averaging: Not regularly.  eye exam within last year: Due  Has not been checking blood sugars.  BP, now at goal <130/80  On no medication. BP Readings from Last 3 Encounters:  08/29/13 120/88  02/19/13 130/94  11/07/12 130/84  Not checking at home.  Elevated Cholesterol: LDL now at goal <100 on atorvastatin , trigs still high.  Lab Results  Component Value Date   CHOL 158 08/27/2013   HDL 35.50* 08/27/2013   LDLCALC 127* 02/06/2008   LDLDIRECT 76.1 08/27/2013   TRIG 298.0* 08/27/2013   CHOLHDL 4 08/27/2013  Liver function tests have resolved.  Using medications without problems:None  Muscle aches: None  Diet compliance  STILL he has not been working very hard on this  Exercise: Has not started exercising yet. Other complaints:  Wt Readings from Last 3 Encounters:  08/29/13 206 lb 4 oz (93.554 kg)  02/19/13 208 lb (94.348 kg)  11/07/12 209 lb 8 oz (95.029 kg)    Review of Systems  Constitutional: Negative for fever and fatigue.  HENT: Negative for ear pain.  Eyes: Negative for pain.  Respiratory: Negative for shortness of breath.  Cardiovascular: Negative for chest pain.  Gastrointestinal: Negative for abdominal distention.   no dysuria Objective:   Physical Exam  Constitutional: Vital signs are normal. He appears well-developed and well-nourished.  HENT:  Head: Normocephalic.  Right Ear: Hearing normal.  Left Ear: Hearing normal.  Nose: Nose normal.  Mouth/Throat: Oropharynx is clear and moist and mucous membranes are normal.  Neck: Trachea normal. Carotid bruit is not present. No mass and no thyromegaly present.  Cardiovascular: Normal rate, regular rhythm and normal pulses. Exam reveals no gallop, no  distant heart sounds and no friction rub.  No murmur heard. No peripheral edema  Pulmonary/Chest: Effort normal and breath sounds normal. No respiratory distress.  Skin: Skin is warm, dry and intact. Erythematous macule at mid waist, flaky Psychiatric: He has a normal mood and affect. His speech is normal and behavior is normal. Thought content normal.   Diabetic foot exam:  Normal inspection  No skin breakdown  No calluses  Normal DP pulses  Normal sensation to light touch and monofilament  Nails normal

## 2013-08-29 NOTE — Patient Instructions (Addendum)
Start checking blood sugar fasting in AM ( goal <120) and 2 hours after a meal (goal < 180).  Continue working on weight loss, increase exercise and low carb diet.  You can call with your blood sugars at home in 2 week after increasing the metformin for further adjustment. Remember to set up eye exam soon!  Follow up in 3 months with labs prior.

## 2013-08-29 NOTE — Assessment & Plan Note (Signed)
LDL at goal on atorvastatin, but trig remain high although lower than before Encouraged exercise, weight loss, healthy eating habits.

## 2013-09-03 ENCOUNTER — Telehealth: Payer: Self-pay

## 2013-09-03 NOTE — Telephone Encounter (Signed)
Relevant patient education assigned to patient using Emmi. ° °

## 2013-09-08 ENCOUNTER — Encounter: Payer: Self-pay | Admitting: Podiatry

## 2013-09-08 ENCOUNTER — Ambulatory Visit (INDEPENDENT_AMBULATORY_CARE_PROVIDER_SITE_OTHER): Payer: BC Managed Care – PPO

## 2013-09-08 ENCOUNTER — Ambulatory Visit (INDEPENDENT_AMBULATORY_CARE_PROVIDER_SITE_OTHER): Payer: BC Managed Care – PPO | Admitting: Podiatry

## 2013-09-08 VITALS — BP 143/95 | HR 86 | Resp 16 | Ht 71.0 in | Wt 206.0 lb

## 2013-09-08 DIAGNOSIS — M79672 Pain in left foot: Principal | ICD-10-CM

## 2013-09-08 DIAGNOSIS — M79609 Pain in unspecified limb: Secondary | ICD-10-CM

## 2013-09-08 DIAGNOSIS — M79671 Pain in right foot: Secondary | ICD-10-CM

## 2013-09-08 DIAGNOSIS — M722 Plantar fascial fibromatosis: Secondary | ICD-10-CM

## 2013-09-08 NOTE — Progress Notes (Signed)
   Subjective:    Patient ID: Joseph Lam, male    DOB: 01/04/1966, 48 y.o.   MRN: [id number]  HPI Comments: Need a new pair of inserts for my shoes. My feet seem to be tired and sore , its mostly at the end of the day. My old insoles seem to be worn out      Review of Systems  All other systems reviewed and are negative.       Objective:   Physical Exam I have reviewed his past medical history medications allergies surgeries and social history. Vital signs are stable he is alert and oriented x3. Pulses are strongly palpable bilateral. Neurologic sensorium is intact. Muscle strength is intact bilateral foot. 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 Mild tenderness on palpation and medial continued tubercle of bilateral heel. Radiographic findings are consistent with plantar fasciitis.          Assessment & Plan:  Assessment: Plantar fasciitis bilateral.  Plan: He was scan today for a new set of orthotics

## 2013-10-01 ENCOUNTER — Encounter: Payer: Self-pay | Admitting: Podiatry

## 2013-10-08 ENCOUNTER — Other Ambulatory Visit: Payer: Self-pay | Admitting: Family Medicine

## 2013-11-27 ENCOUNTER — Telehealth: Payer: Self-pay | Admitting: Family Medicine

## 2013-11-27 ENCOUNTER — Other Ambulatory Visit (INDEPENDENT_AMBULATORY_CARE_PROVIDER_SITE_OTHER): Payer: BC Managed Care – PPO

## 2013-11-27 DIAGNOSIS — E1165 Type 2 diabetes mellitus with hyperglycemia: Secondary | ICD-10-CM

## 2013-11-27 DIAGNOSIS — E119 Type 2 diabetes mellitus without complications: Secondary | ICD-10-CM

## 2013-11-27 DIAGNOSIS — E785 Hyperlipidemia, unspecified: Secondary | ICD-10-CM

## 2013-11-27 LAB — COMPREHENSIVE METABOLIC PANEL
ALBUMIN: 4 g/dL (ref 3.5–5.2)
ALT: 48 U/L (ref 0–53)
AST: 23 U/L (ref 0–37)
Alkaline Phosphatase: 45 U/L (ref 39–117)
BILIRUBIN TOTAL: 1 mg/dL (ref 0.2–1.2)
BUN: 13 mg/dL (ref 6–23)
CO2: 28 mEq/L (ref 19–32)
Calcium: 9.4 mg/dL (ref 8.4–10.5)
Chloride: 103 mEq/L (ref 96–112)
Creatinine, Ser: 0.9 mg/dL (ref 0.4–1.5)
GFR: 95.02 mL/min (ref >60.00)
GLUCOSE: 177 mg/dL — AB (ref 70–99)
POTASSIUM: 4.3 meq/L (ref 3.5–5.1)
Sodium: 137 mEq/L (ref 135–145)
TOTAL PROTEIN: 6.6 g/dL (ref 6.0–8.3)

## 2013-11-27 LAB — LIPID PANEL
Cholesterol: 136 mg/dL (ref 0–200)
HDL: 32.1 mg/dL — ABNORMAL LOW (ref >39.00)
LDL Cholesterol: 61 mg/dL (ref 0–99)
TRIGLYCERIDES: 216 mg/dL — AB (ref 0.0–149.0)
Total CHOL/HDL Ratio: 4
VLDL: 43.2 mg/dL — ABNORMAL HIGH (ref 0.0–40.0)

## 2013-11-27 LAB — HEMOGLOBIN A1C: HEMOGLOBIN A1C: 9 % — AB (ref 4.6–6.5)

## 2013-11-27 NOTE — Telephone Encounter (Signed)
Message copied by Jinny Sanders on Thu Nov 27, 2013 10:26 AM ------      Message from: Ellamae Sia      Created: Wed Nov 26, 2013  2:42 PM      Regarding: Lab orders for Thursday, 5.14.15       F/u labs ------

## 2013-11-28 ENCOUNTER — Encounter: Payer: Self-pay | Admitting: Family Medicine

## 2013-11-28 ENCOUNTER — Ambulatory Visit (INDEPENDENT_AMBULATORY_CARE_PROVIDER_SITE_OTHER): Payer: BC Managed Care – PPO | Admitting: Family Medicine

## 2013-11-28 VITALS — BP 106/80 | HR 82 | Temp 98.3°F | Ht 71.0 in | Wt 205.2 lb

## 2013-11-28 DIAGNOSIS — E785 Hyperlipidemia, unspecified: Secondary | ICD-10-CM

## 2013-11-28 DIAGNOSIS — R030 Elevated blood-pressure reading, without diagnosis of hypertension: Secondary | ICD-10-CM

## 2013-11-28 DIAGNOSIS — E1165 Type 2 diabetes mellitus with hyperglycemia: Secondary | ICD-10-CM

## 2013-11-28 DIAGNOSIS — E119 Type 2 diabetes mellitus without complications: Secondary | ICD-10-CM

## 2013-11-28 LAB — HM DIABETES FOOT EXAM

## 2013-11-28 NOTE — Assessment & Plan Note (Signed)
Well controlled 

## 2013-11-28 NOTE — Progress Notes (Signed)
48 year old male presents for 3 month follow up, overdue.   Diabetes:  Worsened control despite initiation of metformin, now on metformin XR 1000 mg daily. NO SE to medication.  Lab Results  Component Value Date   HGBA1C 9.0* 11/27/2013  Feet problems: None  Blood Sugars averaging: Not regularly.  eye exam within last year: Due  Has not been checking blood sugars.   BP, now at goal <130/80 on no medication.  BP Readings from Last 3 Encounters:  11/28/13 106/80  09/08/13 143/95  08/29/13 120/88  Not checking at home.   Elevated Cholesterol: LDL now at goal <100 on atorvastatin , trigs still high.  Lab Results  Component Value Date   CHOL 136 11/27/2013   HDL 32.10* 11/27/2013   LDLCALC 61 11/27/2013   LDLDIRECT 76.1 08/27/2013   TRIG 216.0* 11/27/2013   CHOLHDL 4 11/27/2013  Liver function tests have resolved.  Using medications without problems:None  Muscle aches: None  Diet compliance  Improving some. Exercise: Has not started exercising yet.  Other complaints:  Wt Readings from Last 3 Encounters:  11/28/13 205 lb 4 oz (93.101 kg)  09/08/13 206 lb (93.441 kg)  08/29/13 206 lb 4 oz (93.554 kg)     Review of Systems  Constitutional: Negative for fever and fatigue.  HENT: Negative for ear pain.  Eyes: Negative for pain.  Respiratory: Negative for shortness of breath.  Cardiovascular: Negative for chest pain.  Gastrointestinal: Negative for abdominal distention.  no dysuria  Objective:   Physical Exam  Constitutional: Vital signs are normal. He appears well-developed and well-nourished.  HENT:  Head: Normocephalic.  Right Ear: Hearing normal.  Left Ear: Hearing normal.  Nose: Nose normal.  Mouth/Throat: Oropharynx is clear and moist and mucous membranes are normal.  Neck: Trachea normal. Carotid bruit is not present. No mass and no thyromegaly present.  Cardiovascular: Normal rate, regular rhythm and normal pulses. Exam reveals no gallop, no distant heart sounds and no  friction rub.  No murmur heard. No peripheral edema  Pulmonary/Chest: Effort normal and breath sounds normal. No respiratory distress.  Skin: Skin is warm, dry and intact. Erythematous macule at mid waist, flaky Psychiatric: He has a normal mood and affect. His speech is normal and behavior is normal. Thought content normal.   Diabetic foot exam:  Normal inspection  No skin breakdown  No calluses  Normal DP pulses  Normal sensation to light touch and monofilament  Nails normal

## 2013-11-28 NOTE — Progress Notes (Signed)
Pre visit review using our clinic review tool, if applicable. No additional management support is needed unless otherwise documented below in the visit note. 

## 2013-11-28 NOTE — Assessment & Plan Note (Signed)
LDL at goal on lipitor, no SE. Trigs improving.

## 2013-11-28 NOTE — Patient Instructions (Addendum)
Get back on track with low carb diet.  Increase exercsie 3-5 times a week.  Call if you are interested in nutrition referral.  Set up yearly eye exam.  Follow up in 3 months with DM.

## 2013-11-28 NOTE — Assessment & Plan Note (Signed)
Poor control... Not sticking to diet and no exercise. He will start aggressive lifestyle change. Continue current metformin dose but may need to increase at next OV if A1C < 7.

## 2014-02-23 ENCOUNTER — Telehealth: Payer: Self-pay | Admitting: Family Medicine

## 2014-02-23 DIAGNOSIS — E1165 Type 2 diabetes mellitus with hyperglycemia: Secondary | ICD-10-CM

## 2014-02-23 NOTE — Telephone Encounter (Signed)
Message copied by Jinny Sanders on Mon Feb 23, 2014 10:29 PM ------      Message from: Ellamae Sia      Created: Mon Feb 16, 2014 11:47 AM      Regarding: Lab orders for Tuesday, 8.11.15       Labs for a 3 month f/u ------

## 2014-02-24 ENCOUNTER — Other Ambulatory Visit: Payer: BC Managed Care – PPO

## 2014-03-03 ENCOUNTER — Ambulatory Visit: Payer: BC Managed Care – PPO | Admitting: Family Medicine

## 2014-03-18 ENCOUNTER — Other Ambulatory Visit: Payer: BC Managed Care – PPO

## 2014-03-20 ENCOUNTER — Other Ambulatory Visit (INDEPENDENT_AMBULATORY_CARE_PROVIDER_SITE_OTHER): Payer: BC Managed Care – PPO

## 2014-03-20 ENCOUNTER — Ambulatory Visit: Payer: BC Managed Care – PPO | Admitting: Family Medicine

## 2014-03-20 DIAGNOSIS — E1165 Type 2 diabetes mellitus with hyperglycemia: Secondary | ICD-10-CM

## 2014-03-20 DIAGNOSIS — E119 Type 2 diabetes mellitus without complications: Secondary | ICD-10-CM

## 2014-03-20 DIAGNOSIS — R7989 Other specified abnormal findings of blood chemistry: Secondary | ICD-10-CM

## 2014-03-20 LAB — COMPREHENSIVE METABOLIC PANEL
ALT: 39 U/L (ref 0–53)
AST: 22 U/L (ref 0–37)
Albumin: 4.1 g/dL (ref 3.5–5.2)
Alkaline Phosphatase: 45 U/L (ref 39–117)
BUN: 13 mg/dL (ref 6–23)
CALCIUM: 9 mg/dL (ref 8.4–10.5)
CO2: 29 meq/L (ref 19–32)
CREATININE: 0.9 mg/dL (ref 0.4–1.5)
Chloride: 103 mEq/L (ref 96–112)
GFR: 94.9 mL/min (ref >60.00)
Glucose, Bld: 184 mg/dL — ABNORMAL HIGH (ref 70–99)
Potassium: 3.9 mEq/L (ref 3.5–5.1)
Sodium: 137 mEq/L (ref 135–145)
Total Bilirubin: 1.3 mg/dL — ABNORMAL HIGH (ref 0.2–1.2)
Total Protein: 7.2 g/dL (ref 6.0–8.3)

## 2014-03-20 LAB — LIPID PANEL
CHOL/HDL RATIO: 5
Cholesterol: 151 mg/dL (ref 0–200)
HDL: 30.7 mg/dL — ABNORMAL LOW (ref >39.00)
NonHDL: 120.3
TRIGLYCERIDES: 310 mg/dL — AB (ref 0.0–149.0)
VLDL: 62 mg/dL — ABNORMAL HIGH (ref 0.0–40.0)

## 2014-03-20 LAB — LDL CHOLESTEROL, DIRECT: Direct LDL: 78.6 mg/dL

## 2014-03-20 LAB — HEMOGLOBIN A1C: Hgb A1c MFr Bld: 9 % — ABNORMAL HIGH (ref 4.6–6.5)

## 2014-03-26 ENCOUNTER — Ambulatory Visit (INDEPENDENT_AMBULATORY_CARE_PROVIDER_SITE_OTHER): Payer: BC Managed Care – PPO | Admitting: Family Medicine

## 2014-03-26 ENCOUNTER — Encounter: Payer: Self-pay | Admitting: Family Medicine

## 2014-03-26 ENCOUNTER — Encounter (INDEPENDENT_AMBULATORY_CARE_PROVIDER_SITE_OTHER): Payer: Self-pay

## 2014-03-26 VITALS — BP 110/80 | HR 81 | Temp 98.4°F | Ht 71.0 in | Wt 202.5 lb

## 2014-03-26 DIAGNOSIS — E1165 Type 2 diabetes mellitus with hyperglycemia: Secondary | ICD-10-CM

## 2014-03-26 DIAGNOSIS — E119 Type 2 diabetes mellitus without complications: Secondary | ICD-10-CM

## 2014-03-26 DIAGNOSIS — E785 Hyperlipidemia, unspecified: Secondary | ICD-10-CM

## 2014-03-26 LAB — HM DIABETES FOOT EXAM

## 2014-03-26 NOTE — Patient Instructions (Addendum)
Increase exercise. Make better food choices, limit portion size. Check blood sugar daily, fasting goal < 120, 2 hours after meals < 180.  Schedule yearly eye exam ASAP.  Diabetes Mellitus and Food It is important for you to manage your blood sugar (glucose) level. Your blood glucose level can be greatly affected by what you eat. Eating healthier foods in the appropriate amounts throughout the day at about the same time each day will help you control your blood glucose level. It can also help slow or prevent worsening of your diabetes mellitus. Healthy eating may even help you improve the level of your blood pressure and reach or maintain a healthy weight.  HOW CAN FOOD AFFECT ME? Carbohydrates Carbohydrates affect your blood glucose level more than any other type of food. Your dietitian will help you determine how many carbohydrates to eat at each meal and teach you how to count carbohydrates. Counting carbohydrates is important to keep your blood glucose at a healthy level, especially if you are using insulin or taking certain medicines for diabetes mellitus. Alcohol Alcohol can cause sudden decreases in blood glucose (hypoglycemia), especially if you use insulin or take certain medicines for diabetes mellitus. Hypoglycemia can be a life-threatening condition. Symptoms of hypoglycemia (sleepiness, dizziness, and disorientation) are similar to symptoms of having too much alcohol.  If your health care provider has given you approval to drink alcohol, do so in moderation and use the following guidelines:  Women should not have more than one drink per day, and men should not have more than two drinks per day. One drink is equal to:  12 oz of beer.  5 oz of wine.  1 oz of hard liquor.  Do not drink on an empty stomach.  Keep yourself hydrated. Have water, diet soda, or unsweetened iced tea.  Regular soda, juice, and other mixers might contain a lot of carbohydrates and should be counted. WHAT  FOODS ARE NOT RECOMMENDED? As you make food choices, it is important to remember that all foods are not the same. Some foods have fewer nutrients per serving than other foods, even though they might have the same number of calories or carbohydrates. It is difficult to get your body what it needs when you eat foods with fewer nutrients. Examples of foods that you should avoid that are high in calories and carbohydrates but low in nutrients include:  Trans fats (most processed foods list trans fats on the Nutrition Facts label).  Regular soda.  Juice.  Candy.  Sweets, such as cake, pie, doughnuts, and cookies.  Fried foods. WHAT FOODS CAN I EAT? Have nutrient-rich foods, which will nourish your body and keep you healthy. The food you should eat also will depend on several factors, including:  The calories you need.  The medicines you take.  Your weight.  Your blood glucose level.  Your blood pressure level.  Your cholesterol level. You also should eat a variety of foods, including:  Protein, such as meat, poultry, fish, tofu, nuts, and seeds (lean animal proteins are best).  Fruits.  Vegetables.  Dairy products, such as milk, cheese, and yogurt (low fat is best).  Breads, grains, pasta, cereal, rice, and beans.  Fats such as olive oil, trans fat-free margarine, canola oil, avocado, and olives. DOES EVERYONE WITH DIABETES MELLITUS HAVE THE SAME MEAL PLAN? Because every person with diabetes mellitus is different, there is not one meal plan that works for everyone. It is very important that you meet with a dietitian  who will help you create a meal plan that is just right for you. Document Released: 03/30/2005 Document Revised: 07/08/2013 Document Reviewed: 05/30/2013 [facility] Patient Information 2015 [place], [place]. This information is not intended to replace advice given to you by your health care provider. Make sure you discuss any questions you have with your health care  provider.

## 2014-03-26 NOTE — Progress Notes (Signed)
Pre visit review using our clinic review tool, if applicable. No additional management support is needed unless otherwise documented below in the visit note. 

## 2014-03-26 NOTE — Progress Notes (Signed)
48 year old male presents for 3 month follow up.   Diabetes: Poor control despite initiation of metformin, now on metformin XR 1000 mg daily. No SE to medication.  He has tried lifestyle changes. Lab Results  Component Value Date   HGBA1C 9.0* 03/20/2014  Feet problems: None  Blood Sugars averaging: Not regularly.  Eye exam within last year: Due  Has not been checking blood sugars.   BP, now at goal <130/80 on no medication.  BP Readings from Last 3 Encounters:  03/26/14 110/80  11/28/13 106/80  09/08/13 143/95     Elevated Cholesterol: LDL now at goal <100 on atorvastatin 40 mg, trigs still high.  Lab Results  Component Value Date   CHOL 151 03/20/2014   HDL 30.70* 03/20/2014   LDLCALC 61 11/27/2013   LDLDIRECT 78.6 03/20/2014   TRIG 310.0* 03/20/2014   CHOLHDL 5 03/20/2014  Liver function test elevations have resolved.  Using medications without problems:None  Muscle aches: None  Diet compliance: poor , unable to maintain.   Exercise: Walking daily with the dog. Other complaints:   He has lost some wieght Wt Readings from Last 3 Encounters:  03/26/14 202 lb 8 oz (91.853 kg)  11/28/13 205 lb 4 oz (93.101 kg)  09/08/13 206 lb (93.441 kg)    Review of Systems  Constitutional: Negative for fever and fatigue.  HENT: Negative for ear pain.  Eyes: Negative for pain.  Respiratory: Negative for shortness of breath.  Cardiovascular: Negative for chest pain.  Gastrointestinal: Negative for abdominal distention.  no dysuria  Objective:   Physical Exam  Constitutional: Vital signs are normal. He appears well-developed and well-nourished.  HENT:  Head: Normocephalic.  Right Ear: Hearing normal.  Left Ear: Hearing normal.  Nose: Nose normal.  Mouth/Throat: Oropharynx is clear and moist and mucous membranes are normal.  Neck: Trachea normal. Carotid bruit is not present. No mass and no thyromegaly present.  Cardiovascular: Normal rate, regular rhythm and normal pulses. Exam reveals no  gallop, no distant heart sounds and no friction rub.  No murmur heard. No peripheral edema  Pulmonary/Chest: Effort normal and breath sounds normal. No respiratory distress.  Skin: Skin is warm, dry and intact. Erythematous macule at mid waist, flaky Psychiatric: He has a normal mood and affect. His speech is normal and behavior is normal. Thought content normal.  Diabetic foot exam:  Normal inspection  No skin breakdown  No calluses  Normal DP pulses  Normal sensation to light touch and monofilament  Nails normal

## 2014-03-26 NOTE — Assessment & Plan Note (Signed)
Recommended increase in medicaiton. Pt refuses at this time. Wants to work on lifestyle changes. Increasing exercise , reviewed diet info. Has seen nutritionist in last 1 year.

## 2014-03-26 NOTE — Assessment & Plan Note (Signed)
LDL at goal < 100 on atorvastatin.  Ttrigs still high given blood sugar is high.

## 2014-04-23 ENCOUNTER — Other Ambulatory Visit: Payer: Self-pay | Admitting: Family Medicine

## 2014-06-26 ENCOUNTER — Telehealth: Payer: Self-pay | Admitting: Family Medicine

## 2014-06-26 ENCOUNTER — Other Ambulatory Visit: Payer: BC Managed Care – PPO

## 2014-06-26 DIAGNOSIS — E1165 Type 2 diabetes mellitus with hyperglycemia: Secondary | ICD-10-CM

## 2014-06-26 DIAGNOSIS — C61 Malignant neoplasm of prostate: Secondary | ICD-10-CM

## 2014-06-26 NOTE — Telephone Encounter (Signed)
-----   Message from Ellamae Sia sent at 06/17/2014  3:08 PM EST ----- Regarding: Lab orders for Friday, 12.11.15 Patient is scheduled for CPX labs, please order future labs, Thanks , Karna Christmas

## 2014-06-29 ENCOUNTER — Other Ambulatory Visit (INDEPENDENT_AMBULATORY_CARE_PROVIDER_SITE_OTHER): Payer: BC Managed Care – PPO

## 2014-06-29 DIAGNOSIS — E1165 Type 2 diabetes mellitus with hyperglycemia: Secondary | ICD-10-CM

## 2014-06-29 DIAGNOSIS — C61 Malignant neoplasm of prostate: Secondary | ICD-10-CM

## 2014-06-29 DIAGNOSIS — E119 Type 2 diabetes mellitus without complications: Secondary | ICD-10-CM

## 2014-06-29 LAB — COMPREHENSIVE METABOLIC PANEL
ALK PHOS: 48 U/L (ref 39–117)
ALT: 38 U/L (ref 0–53)
AST: 18 U/L (ref 0–37)
Albumin: 4.1 g/dL (ref 3.5–5.2)
BILIRUBIN TOTAL: 1.1 mg/dL (ref 0.2–1.2)
BUN: 10 mg/dL (ref 6–23)
CO2: 27 mEq/L (ref 19–32)
Calcium: 9.2 mg/dL (ref 8.4–10.5)
Chloride: 108 mEq/L (ref 96–112)
Creatinine, Ser: 0.8 mg/dL (ref 0.4–1.5)
GFR: 108.6 mL/min (ref >60.00)
Glucose, Bld: 146 mg/dL — ABNORMAL HIGH (ref 70–99)
Potassium: 4.4 mEq/L (ref 3.5–5.1)
Sodium: 139 mEq/L (ref 135–145)
Total Protein: 6.6 g/dL (ref 6.0–8.3)

## 2014-06-29 LAB — LIPID PANEL
CHOLESTEROL: 141 mg/dL (ref 0–200)
HDL: 30.6 mg/dL — AB (ref >39.00)
LDL Cholesterol: 71 mg/dL (ref 0–99)
NonHDL: 110.4
Total CHOL/HDL Ratio: 5
Triglycerides: 196 mg/dL — ABNORMAL HIGH (ref 0.0–149.0)
VLDL: 39.2 mg/dL (ref 0.0–40.0)

## 2014-06-29 LAB — PSA: PSA: 0.76 ng/mL (ref 0.10–4.00)

## 2014-06-29 LAB — HEMOGLOBIN A1C: Hgb A1c MFr Bld: 9.2 % — ABNORMAL HIGH (ref 4.6–6.5)

## 2014-07-03 ENCOUNTER — Ambulatory Visit (INDEPENDENT_AMBULATORY_CARE_PROVIDER_SITE_OTHER): Payer: BC Managed Care – PPO | Admitting: Family Medicine

## 2014-07-03 ENCOUNTER — Encounter: Payer: Self-pay | Admitting: Family Medicine

## 2014-07-03 VITALS — BP 130/90 | HR 80 | Temp 98.6°F | Ht 69.25 in | Wt 195.5 lb

## 2014-07-03 DIAGNOSIS — E1165 Type 2 diabetes mellitus with hyperglycemia: Secondary | ICD-10-CM

## 2014-07-03 DIAGNOSIS — Z1211 Encounter for screening for malignant neoplasm of colon: Secondary | ICD-10-CM

## 2014-07-03 DIAGNOSIS — Z23 Encounter for immunization: Secondary | ICD-10-CM

## 2014-07-03 DIAGNOSIS — E119 Type 2 diabetes mellitus without complications: Secondary | ICD-10-CM

## 2014-07-03 DIAGNOSIS — Z0000 Encounter for general adult medical examination without abnormal findings: Secondary | ICD-10-CM

## 2014-07-03 LAB — HM DIABETES FOOT EXAM

## 2014-07-03 NOTE — Patient Instructions (Addendum)
Add exercise to you regimen.. 3 times a week try more aerobic exercise.  Keep up the great work on low carb diet.  Check blood sugar at home, FBS < 120, 2 hours after meals < 180.  Set up eye exam for re-eval. Stop at the lab on way to pick up stool test.  Follow up DM in 3 months with labs prior.

## 2014-07-03 NOTE — Assessment & Plan Note (Signed)
FBS down, trigs down, 7 lb weight loss on low carb diet, no exercise. Likely A1C not reflecting change yet.  Pt will continue metformin, but will continue low carb diet, add exercise and recheck in 3 months.

## 2014-07-03 NOTE — Addendum Note (Signed)
Addended by: Carter Kitten on: 07/03/2014 09:33 AM   Modules accepted: Orders

## 2014-07-03 NOTE — Progress Notes (Signed)
The patient is here for annual wellness exam and preventative care.   Hypertriglyceridemia:  LDL at goal  < 100, trig elevated. On lipitor  40 mg daily. Lab Results  Component Value Date   CHOL 141 06/29/2014   HDL 30.60* 06/29/2014   LDLCALC 71 06/29/2014   LDLDIRECT 78.6 03/20/2014   TRIG 196.0* 06/29/2014   CHOLHDL 5 06/29/2014  No known SE to med, no myalgia.  Wt Readings from Last 3 Encounters:  07/03/14 195 lb 8 oz (88.678 kg)  03/26/14 202 lb 8 oz (91.853 kg)  11/28/13 205 lb 4 oz (93.101 kg)   Diet: night-time eating, portion control good, 2 cans soda a week, mostly water, he has been working on low carb diet in last month. Exercise: Occ  GERD controlled with prilosec 20 mg daily.  Diabetes:  Poor control on metformin 1000 mg daily but has become more aggressive with diet in last month. Lab Results  Component Value Date   HGBA1C 9.2* 06/29/2014  Using medications without difficulties: Hypoglycemic episodes:? Hyperglycemic episodes:? Feet problems: none Blood Sugars averaging: not checking. eye exam within last year: due   BP borderline today, but at home 110-120/77 BP Readings from Last 3 Encounters:  07/03/14 130/90  03/26/14 110/80  11/28/13 106/80    Review of Systems  Constitutional: Positive for fatigue. Negative for fever and unexpected weight change.  HENT: Negative for ear pain, congestion, sore throat, rhinorrhea, trouble swallowing and postnasal drip.  Eyes: Negative for pain.  Respiratory: Negative for cough, shortness of breath and wheezing.  Cardiovascular: Negative for chest pain, palpitations and leg swelling.  Gastrointestinal: Negative for nausea, abdominal pain, diarrhea, constipation and blood in stool.  Genitourinary: Negative for dysuria, urgency, hematuria, discharge, penile swelling, scrotal swelling, difficulty urinating, penile pain and testicular pain.  Skin: Negative for rash.  Neurological: Negative for syncope, weakness,  light-headedness, numbness and headaches.  Psychiatric/Behavioral: Positive for disturbed wake/sleep cycle. Negative for behavioral problems and dysphoric mood. The patient is not nervous/anxious.   Does not have time to sleep.       Objective:   Physical Exam  Constitutional: He appears well-developed and well-nourished. Non-toxic appearance. He does not appear ill. No distress.  HENT:  Head: Normocephalic and atraumatic.  Right Ear: Hearing, tympanic membrane, external ear and ear canal normal.  Left Ear: Hearing, tympanic membrane, external ear and ear canal normal.  Nose: Nose normal.  Mouth/Throat: Uvula is midline, oropharynx is clear and moist and mucous membranes are normal.  Eyes: Conjunctivae normal, EOM and lids are normal. Pupils are equal, round, and reactive to light. No foreign bodies found.  Neck: Trachea normal, normal range of motion and phonation normal. Neck supple. Carotid bruit is not present. No mass and no thyromegaly present.  Cardiovascular: Normal rate, regular rhythm, S1 normal, S2 normal, intact distal pulses and normal pulses. Exam reveals no gallop.  No murmur heard. Pulmonary/Chest: Breath sounds normal. He has no wheezes. He has no rhonchi. He has no rales.  Abdominal: Soft. Normal appearance and bowel sounds are normal. There is no hepatosplenomegaly. There is no tenderness. There is no rebound, no guarding and no CVA tenderness. No hernia. Hernia confirmed negative in the right inguinal area and confirmed negative in the left inguinal area.  Genitourinary: Prostate normal, testes normal and penis normal. Rectal exam shows no external hemorrhoid, no internal hemorrhoid, no fissure, no mass, no tenderness and anal tone normal. Guaiac negative stool. Prostate is not enlarged and not tender. Right testis shows  no mass and no tenderness. Left testis shows no mass and no tenderness. No paraphimosis or penile tenderness.  Lymphadenopathy:   He has no  cervical adenopathy.   Right: No inguinal adenopathy present.   Left: No inguinal adenopathy present.  Neurological: He is alert. He has normal strength and normal reflexes. No cranial nerve deficit or sensory deficit. Gait normal.  Skin: Skin is warm, dry and intact. No rash noted.  Psychiatric: He has a normal mood and affect. His speech is normal and behavior is normal. Judgment normal.   Diabetic foot exam: Normal inspection No skin breakdown No calluses  Normal DP pulses Normal sensation to light touch and monofilament Nails normal         Assessment & Plan:  The patient's preventative maintenance and recommended screening tests for an annual wellness exam were reviewed in full today. Brought up to date unless services declined.  Counselled on the importance of diet, exercise, and its role in overall health and mortality. The patient's FH and SH was reviewed, including their home life, tobacco status, and drug and alcohol status.   Prostate cancer Dad age 21.  Lab Results  Component Value Date   PSA 0.76 06/29/2014   PSA 0.89 04/01/2012   PSA 0.90 05/31/2010  Vaccines:  Uptodate tdap, will get flu today. Colon cancer screening:  Plans ifob  nonsmoker

## 2014-07-03 NOTE — Progress Notes (Signed)
Pre visit review using our clinic review tool, if applicable. No additional management support is needed unless otherwise documented below in the visit note. 

## 2014-07-13 ENCOUNTER — Other Ambulatory Visit (INDEPENDENT_AMBULATORY_CARE_PROVIDER_SITE_OTHER): Payer: BC Managed Care – PPO

## 2014-07-13 DIAGNOSIS — Z1211 Encounter for screening for malignant neoplasm of colon: Secondary | ICD-10-CM

## 2014-07-13 LAB — FECAL OCCULT BLOOD, IMMUNOCHEMICAL: FECAL OCCULT BLD: POSITIVE — AB

## 2014-07-20 ENCOUNTER — Telehealth: Payer: Self-pay | Admitting: Family Medicine

## 2014-07-20 DIAGNOSIS — R195 Other fecal abnormalities: Secondary | ICD-10-CM

## 2014-07-20 NOTE — Telephone Encounter (Signed)
-----   Message from Carter Kitten, [place] sent at 07/20/2014 12:56 PM EST ----- Spoke with Joseph Lam.  He would like to move forward with referral for colonoscopy.

## 2014-08-06 ENCOUNTER — Encounter: Payer: Self-pay | Admitting: Internal Medicine

## 2014-09-05 ENCOUNTER — Other Ambulatory Visit: Payer: Self-pay | Admitting: Family Medicine

## 2014-09-23 ENCOUNTER — Ambulatory Visit (AMBULATORY_SURGERY_CENTER): Payer: Self-pay

## 2014-09-23 ENCOUNTER — Ambulatory Visit (INDEPENDENT_AMBULATORY_CARE_PROVIDER_SITE_OTHER): Payer: BC Managed Care – PPO | Admitting: Podiatry

## 2014-09-23 VITALS — Ht 70.0 in | Wt 195.4 lb

## 2014-09-23 VITALS — BP 134/85 | HR 78 | Resp 16 | Wt 195.0 lb

## 2014-09-23 DIAGNOSIS — Z1211 Encounter for screening for malignant neoplasm of colon: Secondary | ICD-10-CM

## 2014-09-23 DIAGNOSIS — M722 Plantar fascial fibromatosis: Secondary | ICD-10-CM

## 2014-09-23 MED ORDER — MOVIPREP 100 G PO SOLR: 1.0000 | Freq: Once | ORAL | Status: DC

## 2014-09-23 NOTE — Progress Notes (Signed)
He presents today with a chief complaint of my feet feel like they are rolling toward the outside. He states that it feels like he may need a new pair of orthotics. He denies any changes in his past medical history medications allergies or surgeries. States that his diabetes is controlled with diet and oral medication. He denies tingling or numbness in his hands or feet.  Objective: Vital signs are stable he is alert and oriented 3. Pulses are palpable bilateral. Neurologic sensorium is intact per Semmes-Weinstein monofilament. Muscle strength +5 over 5 dorsiflexion plantar flexors inverters and everters all intrinsic musculature is intact. Orthopedic evaluation does demonstrate flexible pes planus bilateral. Mild tenderness on palpation of the medial calcaneal tubercles bilateral this is consistent with plantar fasciitis. No radiographs were taken today. Cutaneous evaluation demonstrates supple well-hydrated cutis no erythema edema saline as drainage or odor.  Assessment: Diabetes without complications. Plantar fasciitis with pes planus bilateral.  Plan: Discussed etiology pathology conservative versus surgical therapies. At this point he was scanned for a new set of orthotics. we discussed appropriate shoe gear stretching exercises ice therapy and shoe modifications. I will follow up with him once his orthotics come in.

## 2014-09-23 NOTE — Progress Notes (Signed)
No allergies to eggs or soy No diet/weight loss meds No home oxygen No past problems with anesthesia  Has email  Emmi instructions given for colonoscopy 

## 2014-09-25 ENCOUNTER — Telehealth: Payer: Self-pay | Admitting: Family Medicine

## 2014-09-25 ENCOUNTER — Other Ambulatory Visit: Payer: BC Managed Care – PPO

## 2014-09-25 DIAGNOSIS — E1165 Type 2 diabetes mellitus with hyperglycemia: Secondary | ICD-10-CM

## 2014-09-25 DIAGNOSIS — E785 Hyperlipidemia, unspecified: Secondary | ICD-10-CM

## 2014-09-25 NOTE — Telephone Encounter (Signed)
-----   Message from Ellamae Sia sent at 09/17/2014  4:56 PM EST ----- Regarding: Lab orders for Friday, 3.11.16 Lab orders for a f/u appt

## 2014-09-28 ENCOUNTER — Other Ambulatory Visit (INDEPENDENT_AMBULATORY_CARE_PROVIDER_SITE_OTHER): Payer: BC Managed Care – PPO

## 2014-09-28 DIAGNOSIS — E785 Hyperlipidemia, unspecified: Secondary | ICD-10-CM

## 2014-09-28 DIAGNOSIS — E119 Type 2 diabetes mellitus without complications: Secondary | ICD-10-CM

## 2014-09-28 DIAGNOSIS — E1165 Type 2 diabetes mellitus with hyperglycemia: Secondary | ICD-10-CM

## 2014-09-28 LAB — COMPREHENSIVE METABOLIC PANEL
ALBUMIN: 4.5 g/dL (ref 3.5–5.2)
ALK PHOS: 42 U/L (ref 39–117)
ALT: 33 U/L (ref 0–53)
AST: 19 U/L (ref 0–37)
BUN: 12 mg/dL (ref 6–23)
CALCIUM: 9.3 mg/dL (ref 8.4–10.5)
CHLORIDE: 106 meq/L (ref 96–112)
CO2: 28 mEq/L (ref 19–32)
Creatinine, Ser: 0.81 mg/dL (ref 0.40–1.50)
GFR: 106.95 mL/min (ref >60.00)
Glucose, Bld: 134 mg/dL — ABNORMAL HIGH (ref 70–99)
Potassium: 3.9 mEq/L (ref 3.5–5.1)
Sodium: 139 mEq/L (ref 135–145)
Total Bilirubin: 0.9 mg/dL (ref 0.2–1.2)
Total Protein: 7.1 g/dL (ref 6.0–8.3)

## 2014-09-28 LAB — LIPID PANEL
CHOL/HDL RATIO: 4
Cholesterol: 140 mg/dL (ref 0–200)
HDL: 31.6 mg/dL — AB (ref >39.00)
LDL CALC: 73 mg/dL (ref 0–99)
NonHDL: 108.4
Triglycerides: 175 mg/dL — ABNORMAL HIGH (ref 0.0–149.0)
VLDL: 35 mg/dL (ref 0.0–40.0)

## 2014-09-28 LAB — HEMOGLOBIN A1C: Hgb A1c MFr Bld: 7.6 % — ABNORMAL HIGH (ref 4.6–6.5)

## 2014-10-02 ENCOUNTER — Encounter: Payer: Self-pay | Admitting: Family Medicine

## 2014-10-02 ENCOUNTER — Ambulatory Visit (INDEPENDENT_AMBULATORY_CARE_PROVIDER_SITE_OTHER): Payer: BC Managed Care – PPO | Admitting: Family Medicine

## 2014-10-02 VITALS — BP 124/88 | HR 80 | Temp 97.7°F | Wt 192.5 lb

## 2014-10-02 DIAGNOSIS — E785 Hyperlipidemia, unspecified: Secondary | ICD-10-CM

## 2014-10-02 DIAGNOSIS — E1165 Type 2 diabetes mellitus with hyperglycemia: Secondary | ICD-10-CM

## 2014-10-02 DIAGNOSIS — E119 Type 2 diabetes mellitus without complications: Secondary | ICD-10-CM

## 2014-10-02 LAB — HM DIABETES FOOT EXAM

## 2014-10-02 NOTE — Patient Instructions (Addendum)
Due for yearly eye exam.  Continue working on low carb diet. Working on increasing exercise. Follow up in 3 month with labs prior

## 2014-10-02 NOTE — Progress Notes (Signed)
49 year old male presents for 3 month follow up.   He has been having some recent sinus  Congestion in last week.   Occ fits of sneeze. Occ pressure in face, mild, no fever. Tread tylenol severe sinus, helps some. No ear pain.    Hypertriglyceridemia: LDL at goal < 100, trig elevated. On lipitor 40 mg daily. Lab Results  Component Value Date   CHOL 140 09/28/2014   HDL 31.60* 09/28/2014   LDLCALC 73 09/28/2014   LDLDIRECT 78.6 03/20/2014   TRIG 175.0* 09/28/2014   CHOLHDL 4 09/28/2014  No SE to lipitor.   Wt Readings from Last 3 Encounters:  10/02/14 192 lb 8 oz (87.317 kg)  09/23/14 195 lb (88.451 kg)  09/23/14 195 lb 6.4 oz (88.633 kg)   Diabetes: Improving  control on metformin 1000 mg daily but has become more aggressive with diet in last month. Lab Results  Component Value Date   HGBA1C 7.6* 09/28/2014  Using medications without difficulties: none Hypoglycemic episodes:? Hyperglycemic episodes:? Feet problems:none Blood Sugars averaging:not checking eye exam within last year:due  Making changes with healthy eating.  He has increase exercise and walking the dog.   BP at goal here and at home.  BP Readings from Last 3 Encounters:  10/02/14 124/88  09/23/14 134/85  07/03/14 130/90    Review of Systems  Constitutional: Positive for fatigue. Negative for fever and unexpected weight change.  HENT: Negative fo.lastbp3 r ear pain, congestion, sore throat, rhinorrhea, trouble swallowing and postnasal drip.  Eyes: Negative for pain.  Respiratory: Negative for cough, shortness of breath and wheezing.  Cardiovascular: Negative for chest pain, palpitations and leg swelling.  Gastrointestinal: Negative for nausea, abdominal pain, diarrhea, constipation and blood in stool.  Genitourinary: Negative for dysuria, urgency, hematuria, discharge, penile swelling, scrotal swelling, difficulty urinating, penile pain and testicular pain.  Skin: Negative for rash.   Neurological: Negative for syncope, weakness, light-headedness, numbness and headaches.  Psychiatric/Behavioral: . Negative for behavioral problems and dysphoric mood. The patient is not nervous/anxious.        Objective:  Physical Exam  Constitutional: He appears well-developed and well-nourished. Non-toxic appearance. He does not appear ill. No distress.  HENT:  Head: Normocephalic and atraumatic.  Right Ear: Hearing, tympanic membrane, external ear and ear canal normal.  Left Ear: Hearing, tympanic membrane, external ear and ear canal normal.  Nose: Nose normal.  Mouth/Throat: Uvula is midline, oropharynx is clear and moist and mucous membranes are normal.  Eyes: Conjunctivae normal, EOM and lids are normal. Pupils are equal, round, and reactive to light. No foreign bodies found.  Neck: Trachea normal, normal range of motion and phonation normal. Neck supple. Carotid bruit is not present. No mass and no thyromegaly present.  Cardiovascular: Normal rate, regular rhythm, S1 normal, S2 normal, intact distal pulses and normal pulses. Exam reveals no gallop.  No murmur heard. Pulmonary/Chest: Breath sounds normal. He has no wheezes. He has no rhonchi. He has no rales.  Abdominal: Soft. Normal appearance and bowel sounds are normal. There is no hepatosplenomegaly. There is no tenderness. There is no rebound, no guarding and no CVA tenderness. No hernia. Hernia confirmed negative in the right inguinal area and confirmed negative in the left inguinal area.  Genitourinary: Prostate normal, testes normal and penis normal. Rectal exam shows no external hemorrhoid, no internal hemorrhoid, no fissure, no mass, no tenderness and anal tone normal. Guaiac negative stool. Prostate is not enlarged and not tender. Right testis shows no mass  and no tenderness. Left testis shows no mass and no tenderness. No paraphimosis or penile tenderness.  Lymphadenopathy:   He has no cervical  adenopathy.   Right: No inguinal adenopathy present.   Left: No inguinal adenopathy present.  Neurological: He is alert. He has normal strength and normal reflexes. No cranial nerve deficit or sensory deficit. Gait normal.  Skin: Skin is warm, dry and intact. No rash noted.  Psychiatric: He has a normal mood and affect. His speech is normal and behavior is normal. Judgment normal.   Diabetic foot exam: Normal inspection No skin breakdown No calluses  Normal DP pulses Normal sensation to light touch and monofilament Nails normal

## 2014-10-02 NOTE — Progress Notes (Signed)
Pre visit review using our clinic review tool, if applicable. No additional management support is needed unless otherwise documented below in the visit note. 

## 2014-10-02 NOTE — Assessment & Plan Note (Signed)
Trig improving with diet... LDL at goal on liptor.

## 2014-10-02 NOTE — Assessment & Plan Note (Signed)
Improving control with diet and exercise.. Continue changes.

## 2014-10-07 ENCOUNTER — Encounter: Payer: Self-pay | Admitting: Internal Medicine

## 2014-10-07 ENCOUNTER — Ambulatory Visit (AMBULATORY_SURGERY_CENTER): Payer: BC Managed Care – PPO | Admitting: Internal Medicine

## 2014-10-07 VITALS — BP 112/75 | HR 70 | Temp 97.4°F | Resp 9 | Ht 70.0 in | Wt 195.0 lb

## 2014-10-07 DIAGNOSIS — D128 Benign neoplasm of rectum: Secondary | ICD-10-CM

## 2014-10-07 DIAGNOSIS — D125 Benign neoplasm of sigmoid colon: Secondary | ICD-10-CM

## 2014-10-07 DIAGNOSIS — D122 Benign neoplasm of ascending colon: Secondary | ICD-10-CM | POA: Diagnosis not present

## 2014-10-07 DIAGNOSIS — D123 Benign neoplasm of transverse colon: Secondary | ICD-10-CM

## 2014-10-07 DIAGNOSIS — Z1211 Encounter for screening for malignant neoplasm of colon: Secondary | ICD-10-CM

## 2014-10-07 DIAGNOSIS — K621 Rectal polyp: Secondary | ICD-10-CM | POA: Diagnosis not present

## 2014-10-07 DIAGNOSIS — D124 Benign neoplasm of descending colon: Secondary | ICD-10-CM

## 2014-10-07 MED ORDER — SODIUM CHLORIDE 0.9 % IV SOLN
500.0000 mL | INTRAVENOUS | Status: DC
Start: 2014-10-07 — End: 2014-10-07

## 2014-10-07 NOTE — Op Note (Signed)
[place]  [address]. [place], [address]   COLONOSCOPY PROCEDURE REPORT  PATIENT: Joseph Lam, Joseph Lam  MR#: [id number] BIRTHDATE: 1966-06-28 , 48  yrs. old GENDER: male ENDOSCOPIST: Jerene Bears, MD REFERRED BY:Amy Cletis Athens, M.D. PROCEDURE DATE:  10/07/2014 PROCEDURE:   Colonoscopy with snare polypectomy and Colonoscopy, screening First Screening Colonoscopy - Avg.  risk and is 50 yrs.  old or older Yes.  Prior Negative Screening - Now for repeat screening. N/A  History of Adenoma - Now for follow-up colonoscopy & has been > or = to 3 yrs.  N/A ASA CLASS:   Class III INDICATIONS:Screening for colonic neoplasia and Colorectal Neoplasm Risk Assessment for this procedure is average risk. MEDICATIONS: Monitored anesthesia care and Propofol 250 mg IV  DESCRIPTION OF PROCEDURE:   After the risks benefits and alternatives of the procedure were thoroughly explained, informed consent was obtained.  The digital rectal exam revealed no rectal mass.   The LB [id number]  endoscope was introduced through the anus and advanced to the cecum, which was identified by both the appendix and ileocecal valve. No adverse events experienced. The quality of the prep was (MoviPrep was used) good.  The instrument was then slowly withdrawn as the colon was fully examined.    COLON FINDINGS: Three sessile polyps ranging from 4 to 36mm in size were found in the transverse colon, descending colon, and rectum. Polypectomies were performed with a cold snare.  The resection was complete, the polyp tissue was completely retrieved and sent to histology.   A pedunculated polyp measuring 15 mm in size was found in the sigmoid colon.  A polypectomy was performed using snare cautery.  The resection was complete, the polyp tissue was completely retrieved and sent to histology.   There was moderate diverticulosis noted in the left colon.  Retroflexed views revealed internal hemorrhoids. The  time to cecum = 2.5 Withdrawal time = 13.9   The scope was withdrawn and the procedure completed.  COMPLICATIONS: There were no immediate complications.    ENDOSCOPIC IMPRESSION: 1.   Three sessile polyps ranging from 4 to 56mm in size were found in the transverse colon, descending colon, and rectum; polypectomies were performed with a cold snare 2.   Pedunculated polyp was found in the sigmoid colon; polypectomy was performed using snare cautery 3.   Moderate diverticulosis was noted in the left colon  RECOMMENDATIONS: 1.  Await pathology results 2.  Avoid all NSAIDS for the next 2 weeks. 3.  High fiber diet 4.  If the polyps removed today are proven to be adenomatous (pre-cancerous) polyps, you will need a colonoscopy in 3 years. Otherwise you should continue to follow colorectal cancer screening guidelines for "routine risk" patients with a colonoscopy in 10 years.  You will receive a letter within 1-2 weeks with the results of your biopsy as well as final recommendations.  Please call my office if you have not received a letter after 3 weeks.  eSigned:  Jerene Bears, MD 10/07/2014 9:12 AM   cc: Jinny Sanders, MD and The Patient   PATIENT NAME:  Joseph Lam, Joseph Lam MR#: [id number]

## 2014-10-07 NOTE — Patient Instructions (Signed)
YOU HAD AN ENDOSCOPIC PROCEDURE TODAY AT [facility]:   Refer to the procedure report that was given to you for any specific questions about what was found during the examination.  If the procedure report does not answer your questions, please call your gastroenterologist to clarify.  If you requested that your care partner not be given the details of your procedure findings, then the procedure report has been included in a sealed envelope for you to review at your convenience later.  YOU SHOULD EXPECT: Some feelings of bloating in the abdomen. Passage of more gas than usual.  Walking can help get rid of the air that was put into your GI tract during the procedure and reduce the bloating. If you had a lower endoscopy (such as a colonoscopy or flexible sigmoidoscopy) you may notice spotting of blood in your stool or on the toilet paper. If you underwent a bowel prep for your procedure, you may not have a normal bowel movement for a few days.  Please Note:  You might notice some irritation and congestion in your nose or some drainage.  This is from the oxygen used during your procedure.  There is no need for concern and it should clear up in a day or so.  SYMPTOMS TO REPORT IMMEDIATELY:   Following lower endoscopy (colonoscopy or flexible sigmoidoscopy):  Excessive amounts of blood in the stool  Significant tenderness or worsening of abdominal pains  Swelling of the abdomen that is new, acute  Fever of 100F or higher   For urgent or emergent issues, a gastroenterologist can be reached at any hour by calling [phone].   DIET: Your first meal following the procedure should be a small meal and then it is ok to progress to your normal diet. Heavy or fried foods are harder to digest and may make you feel nauseous or bloated.  Likewise, meals heavy in dairy and vegetables can increase bloating.  Drink plenty of fluids but you should avoid alcoholic beverages for 24  hours.  ACTIVITY:  You should plan to take it easy for the rest of today and you should NOT DRIVE or use heavy machinery until tomorrow (because of the sedation medicines used during the test).    FOLLOW UP: Our staff will call the number listed on your records the next business day following your procedure to check on you and address any questions or concerns that you may have regarding the information given to you following your procedure. If we do not reach you, we will leave a message.  However, if you are feeling well and you are not experiencing any problems, there is no need to return our call.  We will assume that you have returned to your regular daily activities without incident.  If any biopsies were taken you will be contacted by phone or by letter within the next 1-3 weeks.  Please call us at [phone] if you have not heard about the biopsies in 3 weeks.    SIGNATURES/CONFIDENTIALITY: You and/or your care partner have signed paperwork which will be entered into your electronic medical record.  These signatures attest to the fact that that the information above on your After Visit Summary has been reviewed and is understood.  Full responsibility of the confidentiality of this discharge information lies with you and/or your care-partner.  Recommendations Await pathology results, next colonoscopy determined by pathology results; 3 or 10 years.  Avoid all NSAIDS (Aspirin, aspirin products, and anti-inflammatory drugs)  for 2 weeks. Polyp, diverticulosis, and high fiber diet handouts provided.

## 2014-10-07 NOTE — Progress Notes (Signed)
Called to room to assist during endoscopic procedure.  Patient ID and intended procedure confirmed with present staff. Received instructions for my participation in the procedure from the performing physician.  

## 2014-10-07 NOTE — Progress Notes (Signed)
A/ox3 pleased with MAC, report to [facility] RN

## 2014-10-08 ENCOUNTER — Telehealth: Payer: Self-pay | Admitting: *Deleted

## 2014-10-08 NOTE — Telephone Encounter (Signed)
  Follow up Call-  Call back number 10/07/2014  Post procedure Call Back phone  # [phone]  Permission to leave phone message Yes     Patient questions:  Do you have a fever, pain , or abdominal swelling? No. Pain Score  0 *  Have you tolerated food without any problems? Yes.    Have you been able to return to your normal activities? Yes.    Do you have any questions about your discharge instructions: Diet   No. Medications  No. Follow up visit  No.  Do you have questions or concerns about your Care? No.  Actions: * If pain score is 4 or above: No action needed, pain <4.

## 2014-10-19 ENCOUNTER — Encounter: Payer: Self-pay | Admitting: Internal Medicine

## 2015-01-04 ENCOUNTER — Telehealth: Payer: Self-pay | Admitting: Family Medicine

## 2015-01-04 DIAGNOSIS — E1165 Type 2 diabetes mellitus with hyperglycemia: Secondary | ICD-10-CM

## 2015-01-04 DIAGNOSIS — Z125 Encounter for screening for malignant neoplasm of prostate: Secondary | ICD-10-CM

## 2015-01-04 DIAGNOSIS — E785 Hyperlipidemia, unspecified: Secondary | ICD-10-CM

## 2015-01-04 NOTE — Telephone Encounter (Signed)
-----   Message from Ellamae Sia sent at 12/29/2014  4:34 PM EDT ----- Regarding: Lab orders for Wednesday, 6.21.16 Lab orders for a 3 month f/u

## 2015-01-06 ENCOUNTER — Other Ambulatory Visit: Payer: BC Managed Care – PPO

## 2015-01-08 ENCOUNTER — Other Ambulatory Visit: Payer: Self-pay | Admitting: Family Medicine

## 2015-01-08 ENCOUNTER — Ambulatory Visit: Payer: BC Managed Care – PPO | Admitting: Family Medicine

## 2015-03-08 ENCOUNTER — Other Ambulatory Visit (INDEPENDENT_AMBULATORY_CARE_PROVIDER_SITE_OTHER): Payer: BC Managed Care – PPO

## 2015-03-08 DIAGNOSIS — E785 Hyperlipidemia, unspecified: Secondary | ICD-10-CM | POA: Diagnosis not present

## 2015-03-08 DIAGNOSIS — E119 Type 2 diabetes mellitus without complications: Secondary | ICD-10-CM

## 2015-03-08 DIAGNOSIS — Z125 Encounter for screening for malignant neoplasm of prostate: Secondary | ICD-10-CM

## 2015-03-08 DIAGNOSIS — E1165 Type 2 diabetes mellitus with hyperglycemia: Secondary | ICD-10-CM

## 2015-03-08 LAB — COMPREHENSIVE METABOLIC PANEL
ALT: 46 U/L (ref 0–53)
AST: 20 U/L (ref 0–37)
Albumin: 4.6 g/dL (ref 3.5–5.2)
Alkaline Phosphatase: 42 U/L (ref 39–117)
BUN: 17 mg/dL (ref 6–23)
CALCIUM: 9.6 mg/dL (ref 8.4–10.5)
CHLORIDE: 102 meq/L (ref 96–112)
CO2: 28 mEq/L (ref 19–32)
Creatinine, Ser: 0.84 mg/dL (ref 0.40–1.50)
GFR: 102.37 mL/min (ref >60.00)
Glucose, Bld: 158 mg/dL — ABNORMAL HIGH (ref 70–99)
Potassium: 4.3 mEq/L (ref 3.5–5.1)
Sodium: 137 mEq/L (ref 135–145)
Total Bilirubin: 1 mg/dL (ref 0.2–1.2)
Total Protein: 7.1 g/dL (ref 6.0–8.3)

## 2015-03-08 LAB — LIPID PANEL
Cholesterol: 178 mg/dL (ref 0–200)
HDL: 32.3 mg/dL — AB (ref >39.00)
Total CHOL/HDL Ratio: 5

## 2015-03-08 LAB — LDL CHOLESTEROL, DIRECT: LDL DIRECT: 79 mg/dL

## 2015-03-08 LAB — HEMOGLOBIN A1C: Hgb A1c MFr Bld: 7.6 % — ABNORMAL HIGH (ref 4.6–6.5)

## 2015-03-08 LAB — PSA: PSA: 0.79 ng/mL (ref 0.10–4.00)

## 2015-03-12 ENCOUNTER — Encounter: Payer: Self-pay | Admitting: Family Medicine

## 2015-03-12 ENCOUNTER — Ambulatory Visit (INDEPENDENT_AMBULATORY_CARE_PROVIDER_SITE_OTHER): Payer: BC Managed Care – PPO | Admitting: Family Medicine

## 2015-03-12 VITALS — BP 130/84 | HR 73 | Temp 97.6°F | Ht 69.25 in | Wt 201.0 lb

## 2015-03-12 DIAGNOSIS — E1165 Type 2 diabetes mellitus with hyperglycemia: Secondary | ICD-10-CM

## 2015-03-12 DIAGNOSIS — E119 Type 2 diabetes mellitus without complications: Secondary | ICD-10-CM | POA: Diagnosis not present

## 2015-03-12 DIAGNOSIS — E785 Hyperlipidemia, unspecified: Secondary | ICD-10-CM

## 2015-03-12 LAB — HM DIABETES FOOT EXAM

## 2015-03-12 NOTE — Assessment & Plan Note (Signed)
LDL at goal in atorvastatin, but trigs very high. Discussed diet changes to lower, better DM control. Offered fenofibrate to decrease trig given risk of pancreatitis, but pt refused. Will work on healthy eating to lower. If not at goal next OV will start fenofibrate.

## 2015-03-12 NOTE — Progress Notes (Signed)
Pre visit review using our clinic review tool, if applicable. No additional management support is needed unless otherwise documented below in the visit note. 

## 2015-03-12 NOTE — Assessment & Plan Note (Signed)
INadequate control. Pt HAS " fallen off wagon with diet" He is ready to get back on track with aggressive lifestyle change. He is agreeable to increasing metformin if not at goal at next OV.

## 2015-03-12 NOTE — Patient Instructions (Addendum)
Set up yearly eye exam when able given diabetes. Get back on track with healthy low carb diet.  Increase exercise. Conitnue medicaiton for now.

## 2015-03-12 NOTE — Progress Notes (Signed)
49 year old male presents for 3 month follow up. Last seen 5 months ago.  Compliant with medications.  Hypertriglyceridemia: LDL at goal < 100, trig elevated. On lipitor 40 mg daily. Trig > 500 Lab Results  Component Value Date   CHOL 178 03/08/2015   HDL 32.30* 03/08/2015   LDLCALC 73 09/28/2014   LDLDIRECT 79.0 03/08/2015   TRIG * 03/08/2015    515.0 Triglyceride is over 400; calculations on Lipids are invalid.   CHOLHDL 5 03/08/2015   No SE to lipitor.  BP Readings from Last 3 Encounters:  03/12/15 130/84  10/07/14 112/75  10/02/14 124/88   Diabetes: In adequate control on metformin 1000 mg daily.  Lab Results  Component Value Date   HGBA1C 7.6* 03/08/2015  Using medications without difficulties: none Hypoglycemic episodes:None Hyperglycemic episodes:None Feet problems:none Blood Sugars averaging: FBS 150s, not checking regular eye exam within last year:due He has fallen off wagon.. Not staying with healthy eating.  Walking dog 1 mile a night.  Has gained 9 lbs in last 5 months. Wt Readings from Last 3 Encounters:  03/12/15 201 lb (91.173 kg)  10/07/14 195 lb (88.451 kg)  10/02/14 192 lb 8 oz (87.317 kg)   Review of Systems  Constitutional: No fatigue.. Negative for fever and unexpected weight change.  HENT: Negative fo.lastbp3 r ear pain, congestion, sore throat, rhinorrhea, trouble swallowing and postnasal drip.  Eyes: Negative for pain.  Respiratory: Negative for cough, shortness of breath and wheezing.  Cardiovascular: Negative for chest pain, palpitations and leg swelling.  Gastrointestinal: Negative for nausea, abdominal pain, diarrhea, constipation and blood in stool.  Genitourinary: Negative for dysuria, urgency, hematuria, discharge, penile swelling, scrotal swelling, difficulty urinating, penile pain and testicular pain.  Skin: Negative for rash.  Neurological: Negative for syncope, weakness, light-headedness, numbness and headaches.   Psychiatric/Behavioral: . Negative for behavioral problems and dysphoric mood. The patient is not nervous/anxious.        Objective:  Physical Exam  Constitutional: He appears well-developed and well-nourished. Non-toxic appearance. He does not appear ill. No distress.  HENT:  Head: Normocephalic and atraumatic.  Right Ear: Hearing, tympanic membrane, external ear and ear canal normal.  Left Ear: Hearing, tympanic membrane, external ear and ear canal normal.  Nose: Nose normal.  Mouth/Throat: Uvula is midline, oropharynx is clear and moist and mucous membranes are normal.  Eyes: Conjunctivae normal, EOM and lids are normal. Pupils are equal, round, and reactive to light. No foreign bodies found.  Neck: Trachea normal, normal range of motion and phonation normal. Neck supple. Carotid bruit is not present. No mass and no thyromegaly present.  Cardiovascular: Normal rate, regular rhythm, S1 normal, S2 normal, intact distal pulses and normal pulses. Exam reveals no gallop.  No murmur heard. Pulmonary/Chest: Breath sounds normal. He has no wheezes. He has no rhonchi. He has no rales.  Abdominal: Soft. Normal appearance and bowel sounds are normal. There is no hepatosplenomegaly. There is no tenderness. There is no rebound, no guarding and no CVA tenderness. No hernia. Hernia confirmed negative in the right inguinal area and confirmed negative in the left inguinal area.  Lymphadenopathy:   He has no cervical adenopathy.   Right: No inguinal adenopathy present.   Left: No inguinal adenopathy present.  Neurological: He is alert. He has normal strength and normal reflexes. No cranial nerve deficit or sensory deficit. Gait normal.  Skin: Skin is warm, dry and intact. No rash noted.  Psychiatric: He has a normal mood and affect. His  speech is normal and behavior is normal. Judgment normal.   Diabetic foot exam: Normal inspection No skin breakdown No calluses   Normal DP pulses Normal sensation to light touch and monofilament Nails normal

## 2015-04-22 ENCOUNTER — Other Ambulatory Visit: Payer: Self-pay | Admitting: Family Medicine

## 2015-06-17 ENCOUNTER — Ambulatory Visit: Payer: BC Managed Care – PPO | Admitting: Family Medicine

## 2015-07-07 ENCOUNTER — Other Ambulatory Visit: Payer: Self-pay | Admitting: Family Medicine

## 2015-08-03 ENCOUNTER — Telehealth: Payer: Self-pay | Admitting: Family Medicine

## 2015-08-03 ENCOUNTER — Other Ambulatory Visit (INDEPENDENT_AMBULATORY_CARE_PROVIDER_SITE_OTHER): Payer: BC Managed Care – PPO

## 2015-08-03 DIAGNOSIS — E1165 Type 2 diabetes mellitus with hyperglycemia: Secondary | ICD-10-CM

## 2015-08-03 LAB — LIPID PANEL
CHOL/HDL RATIO: 5
Cholesterol: 179 mg/dL (ref 0–200)
HDL: 32.6 mg/dL — AB (ref >39.00)
TRIGLYCERIDES: 426 mg/dL — AB (ref 0.0–149.0)

## 2015-08-03 LAB — COMPREHENSIVE METABOLIC PANEL
ALT: 56 U/L — ABNORMAL HIGH (ref 0–53)
AST: 23 U/L (ref 0–37)
Albumin: 4.4 g/dL (ref 3.5–5.2)
Alkaline Phosphatase: 48 U/L (ref 39–117)
BUN: 11 mg/dL (ref 6–23)
CHLORIDE: 101 meq/L (ref 96–112)
CO2: 28 mEq/L (ref 19–32)
Calcium: 8.8 mg/dL (ref 8.4–10.5)
Creatinine, Ser: 0.83 mg/dL (ref 0.40–1.50)
GFR: 103.63 mL/min (ref >60.00)
GLUCOSE: 269 mg/dL — AB (ref 70–99)
POTASSIUM: 4.1 meq/L (ref 3.5–5.1)
SODIUM: 135 meq/L (ref 135–145)
Total Bilirubin: 1.2 mg/dL (ref 0.2–1.2)
Total Protein: 6.9 g/dL (ref 6.0–8.3)

## 2015-08-03 LAB — HEMOGLOBIN A1C: Hgb A1c MFr Bld: 11.2 % — ABNORMAL HIGH (ref 4.6–6.5)

## 2015-08-03 LAB — LDL CHOLESTEROL, DIRECT: Direct LDL: 75 mg/dL

## 2015-08-03 NOTE — Telephone Encounter (Signed)
-----   Message from Ellamae Sia sent at 08/02/2015  3:53 PM EST ----- Regarding: Lab orders for Tuesday, 1.17.17 Lab orders for a f/u appt

## 2015-08-04 ENCOUNTER — Other Ambulatory Visit: Payer: BC Managed Care – PPO

## 2015-08-06 ENCOUNTER — Encounter: Payer: Self-pay | Admitting: Family Medicine

## 2015-08-06 ENCOUNTER — Ambulatory Visit (INDEPENDENT_AMBULATORY_CARE_PROVIDER_SITE_OTHER): Payer: BC Managed Care – PPO | Admitting: Family Medicine

## 2015-08-06 VITALS — BP 124/90 | HR 83 | Temp 98.4°F | Ht 69.25 in | Wt 197.5 lb

## 2015-08-06 DIAGNOSIS — E785 Hyperlipidemia, unspecified: Secondary | ICD-10-CM

## 2015-08-06 DIAGNOSIS — Z23 Encounter for immunization: Secondary | ICD-10-CM | POA: Diagnosis not present

## 2015-08-06 DIAGNOSIS — E1165 Type 2 diabetes mellitus with hyperglycemia: Secondary | ICD-10-CM | POA: Diagnosis not present

## 2015-08-06 LAB — HM DIABETES FOOT EXAM

## 2015-08-06 MED ORDER — TRIAMCINOLONE ACETONIDE 0.5 % EX CREA: 1.0000 "application " | TOPICAL_CREAM | Freq: Two times a day (BID) | CUTANEOUS | Status: DC

## 2015-08-06 MED ORDER — METFORMIN HCL ER (MOD) 500 MG PO TB24: ORAL_TABLET | ORAL | Status: DC

## 2015-08-06 NOTE — Assessment & Plan Note (Signed)
LDL at goal, trig high but no longer > 500. Encouraged exercise, weight loss, healthy eating habits.Marland Kitchen

## 2015-08-06 NOTE — Assessment & Plan Note (Signed)
Poor control. Poor diet, minimal exercise. Rec aggressive lifestlye change and increase  metfromin to 1500 mg daily.

## 2015-08-06 NOTE — Progress Notes (Addendum)
50 year old male presents for 6 month follow up.   Compliant with medications.  Hypertriglyceridemia: LDL at goal < 100, trig elevated. On lipitor 40 mg daily.  Lab Results  Component Value Date   CHOL 179 08/03/2015   HDL 32.60* 08/03/2015   LDLCALC 73 09/28/2014   LDLDIRECT 75.0 08/03/2015   TRIG 426.0* 08/03/2015   CHOLHDL 5 08/03/2015   No SE to lipitor.  Wt Readings from Last 3 Encounters:  08/06/15 197 lb 8 oz (89.585 kg)  03/12/15 201 lb (91.173 kg)  10/07/14 195 lb (88.451 kg)   Diabetes: In adequate, very poor control on metformin 1000 mg daily.   he has currently been off his diet co=mpletely. Lab Results  Component Value Date   HGBA1C 11.2* 08/03/2015   Using medications without difficulties: none Hypoglycemic episodes:? Hyperglycemic episodes:? Feet problems:none Blood Sugars averaging: not checking eye exam within last year:due He has fallen off wagon.. Not staying with healthy eating. Walking dog 1 mile a night, but slow.  BP Readings from Last 3 Encounters:  08/06/15 124/90  03/12/15 130/84  10/07/14 112/75   Review of Systems  Constitutional: No fatigue.. Negative for fever and unexpected weight change.  HENT: Negative fo.lastbp3 r ear pain, congestion, sore throat, rhinorrhea, trouble swallowing and postnasal drip.  Eyes: Negative for pain.  Respiratory: Negative for cough, shortness of breath and wheezing.  Cardiovascular: Negative for chest pain, palpitations and leg swelling.  Gastrointestinal: Negative for nausea, abdominal pain, diarrhea, constipation and blood in stool.  Genitourinary: Negative for dysuria, urgency, hematuria, discharge, penile swelling, scrotal swelling, difficulty urinating, penile pain and testicular pain.  Skin: Negative for rash.  Neurological: Negative for syncope, weakness, light-headedness, numbness and headaches.  Psychiatric/Behavioral: . Negative for behavioral problems and dysphoric mood. The  patient is not nervous/anxious.        Objective:  Physical Exam  Constitutional: He appears well-developed and well-nourished. Non-toxic appearance. He does not appear ill. No distress.  HENT:  Head: Normocephalic and atraumatic.  Right Ear: . Cerumen impaction irrigated Left Ear:  Cerumen impaction irrigated unable to remove, pt with treat at home Nose: Nose normal.  Mouth/Throat: Uvula is midline, oropharynx is clear and moist and mucous membranes are normal.  Eyes: Conjunctivae normal, EOM and lids are normal. Pupils are equal, round, and reactive to light. No foreign bodies found.  Neck: Trachea normal, normal range of motion and phonation normal. Neck supple. Carotid bruit is not present. No mass and no thyromegaly present.  Cardiovascular: Normal rate, regular rhythm, S1 normal, S2 normal, intact distal pulses and normal pulses. Exam reveals no gallop.  No murmur heard. Pulmonary/Chest: Breath sounds normal. He has no wheezes. He has no rhonchi. He has no rales.  Abdominal: Soft. Normal appearance and bowel sounds are normal. There is no hepatosplenomegaly. There is no tenderness. There is no rebound, no guarding and no CVA tenderness. No hernia. Hernia confirmed negative in the right inguinal area and confirmed negative in the left inguinal area.  Lymphadenopathy:   He has no cervical adenopathy.   Right: No inguinal adenopathy present.   Left: No inguinal adenopathy present.  Neurological: He is alert. He has normal strength and normal reflexes. No cranial nerve deficit or sensory deficit. Gait normal.  Skin: Skin is warm, dry and intact. No rash noted.  Psychiatric: He has a normal mood and affect. His speech is normal and behavior is normal. Judgment normal.   Diabetic foot exam: Normal inspection No skin breakdown No calluses  Normal DP pulses Normal sensation to light touch and monofilament Nails normal

## 2015-08-06 NOTE — Patient Instructions (Addendum)
Increase metformin to 1500 mg daily. Be more aggressive with low sugar diet, low carb diet. Set up yearly eye exam. Apply topical steroid to rash when recurs.

## 2015-08-06 NOTE — Progress Notes (Signed)
Pre visit review using our clinic review tool, if applicable. No additional management support is needed unless otherwise documented below in the visit note. 

## 2015-09-13 ENCOUNTER — Ambulatory Visit: Payer: BC Managed Care – PPO

## 2015-09-13 ENCOUNTER — Encounter: Payer: Self-pay | Admitting: Podiatry

## 2015-09-13 ENCOUNTER — Ambulatory Visit (INDEPENDENT_AMBULATORY_CARE_PROVIDER_SITE_OTHER): Payer: BC Managed Care – PPO | Admitting: Podiatry

## 2015-09-13 VITALS — BP 148/82 | HR 84 | Resp 16

## 2015-09-13 DIAGNOSIS — M722 Plantar fascial fibromatosis: Secondary | ICD-10-CM

## 2015-09-13 NOTE — Progress Notes (Signed)
He presents today for follow-up of his diabetes and plantar fasciitis bilateral. States that as of lately has orthotics have been causing problems as they seem to be rolling him laterally.  Objective: Vital signs stable alert and oriented 3. Pulses are palpable. Mild tenderness on palpation dorsal lateral aspect of the right foot and heels bilaterally. No open lesions or wounds.  Assessment: Plantar fasciitis pes planus bilateral.  Plan: We are fabricate him a new pair of orthotics extrinsic heel.

## 2015-09-13 NOTE — Addendum Note (Signed)
Addended by: Rip Harbour on: 09/13/2015 03:27 PM   Modules accepted: Orders

## 2015-11-02 ENCOUNTER — Other Ambulatory Visit: Payer: Self-pay | Admitting: *Deleted

## 2015-11-02 MED ORDER — METFORMIN HCL 1000 MG PO TABS: 1000.0000 mg | ORAL_TABLET | Freq: Two times a day (BID) | ORAL | Status: DC

## 2015-11-02 NOTE — Telephone Encounter (Signed)
Received fax from [facility] stating [facility] insurance will not cover the Metformin ER.  Ask that Dr. Diona Browner sends in new Rx for Metformin IR.  New Rx [facility] into [facility] for Metformin 1000 mg to take one tablet by mouth two times a day per Dr. Diona Browner.

## 2015-12-14 ENCOUNTER — Other Ambulatory Visit: Payer: BC Managed Care – PPO

## 2015-12-14 ENCOUNTER — Telehealth: Payer: Self-pay | Admitting: Family Medicine

## 2015-12-14 DIAGNOSIS — E1165 Type 2 diabetes mellitus with hyperglycemia: Secondary | ICD-10-CM

## 2015-12-14 DIAGNOSIS — Z125 Encounter for screening for malignant neoplasm of prostate: Secondary | ICD-10-CM

## 2015-12-14 DIAGNOSIS — E785 Hyperlipidemia, unspecified: Secondary | ICD-10-CM

## 2015-12-14 NOTE — Telephone Encounter (Signed)
-----   Message from Ellamae Sia sent at 12/03/2015 11:41 AM EDT ----- Regarding: Lab orders for Tuesday, 5.30.17 Patient is scheduled for CPX labs, please order future labs, Thanks , Karna Christmas

## 2015-12-15 ENCOUNTER — Other Ambulatory Visit (INDEPENDENT_AMBULATORY_CARE_PROVIDER_SITE_OTHER): Payer: BC Managed Care – PPO

## 2015-12-15 DIAGNOSIS — E1165 Type 2 diabetes mellitus with hyperglycemia: Secondary | ICD-10-CM | POA: Diagnosis not present

## 2015-12-15 DIAGNOSIS — Z125 Encounter for screening for malignant neoplasm of prostate: Secondary | ICD-10-CM

## 2015-12-15 DIAGNOSIS — E785 Hyperlipidemia, unspecified: Secondary | ICD-10-CM | POA: Diagnosis not present

## 2015-12-15 LAB — MICROALBUMIN / CREATININE URINE RATIO
CREATININE, U: 138.6 mg/dL
MICROALB/CREAT RATIO: 0.6 mg/g (ref 0.0–30.0)
Microalb, Ur: 0.8 mg/dL (ref 0.0–1.9)

## 2015-12-15 LAB — COMPREHENSIVE METABOLIC PANEL
ALBUMIN: 4.5 g/dL (ref 3.5–5.2)
ALT: 45 U/L (ref 0–53)
AST: 22 U/L (ref 0–37)
Alkaline Phosphatase: 42 U/L (ref 39–117)
BUN: 13 mg/dL (ref 6–23)
CHLORIDE: 103 meq/L (ref 96–112)
CO2: 28 mEq/L (ref 19–32)
CREATININE: 0.73 mg/dL (ref 0.40–1.50)
Calcium: 9.5 mg/dL (ref 8.4–10.5)
GFR: 120.01 mL/min (ref >60.00)
Glucose, Bld: 215 mg/dL — ABNORMAL HIGH (ref 70–99)
Potassium: 4.5 mEq/L (ref 3.5–5.1)
SODIUM: 137 meq/L (ref 135–145)
Total Bilirubin: 1.4 mg/dL — ABNORMAL HIGH (ref 0.2–1.2)
Total Protein: 6.6 g/dL (ref 6.0–8.3)

## 2015-12-15 LAB — LDL CHOLESTEROL, DIRECT: Direct LDL: 65 mg/dL

## 2015-12-15 LAB — LIPID PANEL
CHOL/HDL RATIO: 5
Cholesterol: 171 mg/dL (ref 0–200)
HDL: 31.8 mg/dL — ABNORMAL LOW (ref >39.00)
Triglycerides: 533 mg/dL — ABNORMAL HIGH (ref 0.0–149.0)

## 2015-12-15 LAB — PSA: PSA: 0.96 ng/mL (ref 0.10–4.00)

## 2015-12-15 LAB — HEMOGLOBIN A1C: HEMOGLOBIN A1C: 11.4 % — AB (ref 4.6–6.5)

## 2015-12-17 ENCOUNTER — Encounter: Payer: BC Managed Care – PPO | Admitting: Family Medicine

## 2015-12-20 ENCOUNTER — Encounter: Payer: Self-pay | Admitting: Family Medicine

## 2015-12-20 ENCOUNTER — Ambulatory Visit (INDEPENDENT_AMBULATORY_CARE_PROVIDER_SITE_OTHER): Payer: BC Managed Care – PPO | Admitting: Family Medicine

## 2015-12-20 VITALS — BP 120/80 | HR 82 | Temp 98.4°F | Ht 69.5 in | Wt 193.5 lb

## 2015-12-20 DIAGNOSIS — K219 Gastro-esophageal reflux disease without esophagitis: Secondary | ICD-10-CM | POA: Diagnosis not present

## 2015-12-20 DIAGNOSIS — E1165 Type 2 diabetes mellitus with hyperglycemia: Secondary | ICD-10-CM

## 2015-12-20 DIAGNOSIS — Z0000 Encounter for general adult medical examination without abnormal findings: Secondary | ICD-10-CM

## 2015-12-20 DIAGNOSIS — E785 Hyperlipidemia, unspecified: Secondary | ICD-10-CM

## 2015-12-20 NOTE — Assessment & Plan Note (Signed)
Poor control on metformin. Pt refuses additional med unless he fails aggressive lifestyle changes by next OV. Refer to nutritionist, start regular exercise.

## 2015-12-20 NOTE — Assessment & Plan Note (Signed)
Stable control on prilosec. 

## 2015-12-20 NOTE — Assessment & Plan Note (Signed)
LDL at goal on  Lipitor.  Trig remain high.. Discussed fenofibrate.. Pt wishes to work on lifestyle changes prior to additional medication.

## 2015-12-20 NOTE — Progress Notes (Signed)
Pre visit review using our clinic review tool, if applicable. No additional management support is needed unless otherwise documented below in the visit note. 

## 2015-12-20 NOTE — Progress Notes (Signed)
The patient is here for annual wellness exam and preventative care.   Hypertriglyceridemia: LDL at goal < 100, trig  Very elevated. On lipitor 40 mg daily. Lab Results  Component Value Date   CHOL 171 12/15/2015   HDL 31.80* 12/15/2015   LDLCALC 73 09/28/2014   LDLDIRECT 65.0 12/15/2015   TRIG * 12/15/2015    533.0 Triglyceride is over 400; calculations on Lipids are invalid.   CHOLHDL 5 12/15/2015   No known SE to med, no myalgia.  He has continued to lose weight. Wt Readings from Last 3 Encounters:  12/20/15 193 lb 8 oz (87.771 kg)  08/06/15 197 lb 8 oz (89.585 kg)  03/12/15 201 lb (91.173 kg)   Diet: night-time eating, portion control good, 2 cans soda a week, mostly water, he has been working on low carb diet in last month. Exercise: Occ  GERD controlled with prilosec 20 mg daily.  Diabetes: Poor control on metformin 1000 mg  BID daily. Some diarrhea as SE. Lab Results  Component Value Date   HGBA1C 11.4* 12/15/2015  Using medications without difficulties: Hypoglycemic episodes:? Hyperglycemic episodes:? Feet problems: none Blood Sugars averaging: not checking. eye exam within last year: due  BP at goal.  BP Readings from Last 3 Encounters:  12/20/15 120/80  09/13/15 148/82  08/06/15 124/90   Asthma, no flares except dusty.  Social History /Family History/Past Medical History reviewed and updated if needed.  Review of Systems  Constitutional: Positive for fatigue. Negative for fever and unexpected weight change.  HENT: Negative for ear pain, congestion, sore throat, rhinorrhea, trouble swallowing and postnasal drip.  Eyes: Negative for pain.  Respiratory: Negative for cough, shortness of breath and wheezing.  Cardiovascular: Negative for chest pain, palpitations and leg swelling.  Gastrointestinal: Negative for nausea, abdominal pain, diarrhea, constipation and blood in stool.  Genitourinary: Negative for dysuria, urgency, hematuria,  discharge, penile swelling, scrotal swelling, difficulty urinating, penile pain and testicular pain.  Skin: Negative for rash.  Neurological: Negative for syncope, weakness, light-headedness, numbness and headaches.  Psychiatric/Behavioral: Positive for disturbed wake/sleep cycle. Negative for behavioral problems and dysphoric mood. The patient is not nervous/anxious.   Does not have time to sleep.      Objective:  Physical Exam  Constitutional: He appears well-developed and well-nourished. Non-toxic appearance. He does not appear ill. No distress.  HENT:  Head: Normocephalic and atraumatic.  Right Ear: Hearing, tympanic membrane, external ear and ear canal normal.  Left Ear: Hearing, tympanic membrane, external ear and ear canal normal.  Nose: Nose normal.  Mouth/Throat: Uvula is midline, oropharynx is clear and moist and mucous membranes are normal.  Eyes: Conjunctivae normal, EOM and lids are normal. Pupils are equal, round, and reactive to light. No foreign bodies found.  Neck: Trachea normal, normal range of motion and phonation normal. Neck supple. Carotid bruit is not present. No mass and no thyromegaly present.  Cardiovascular: Normal rate, regular rhythm, S1 normal, S2 normal, intact distal pulses and normal pulses. Exam reveals no gallop.  No murmur heard. Pulmonary/Chest: Breath sounds normal. He has no wheezes. He has no rhonchi. He has no rales.  Abdominal: Soft. Normal appearance and bowel sounds are normal. There is no hepatosplenomegaly. There is no tenderness. There is no rebound, no guarding and no CVA tenderness. No hernia. Hernia confirmed negative in the right inguinal area and confirmed negative in the left inguinal area.  Genitourinary: Prostate normal, testes normal and penis normal. Rectal exam shows no external hemorrhoid, no internal hemorrhoid,  no fissure, no mass, no tenderness and anal tone normal. Guaiac negative stool. Prostate is not  enlarged and not tender. Right testis shows no mass and no tenderness. Left testis shows no mass and no tenderness. No paraphimosis or penile tenderness.  Lymphadenopathy:   He has no cervical adenopathy.   Right: No inguinal adenopathy present.   Left: No inguinal adenopathy present.  Neurological: He is alert. He has normal strength and normal reflexes. No cranial nerve deficit or sensory deficit. Gait normal.  Skin: Skin is warm, dry and intact. No rash noted.  Psychiatric: He has a normal mood and affect. His speech is normal and behavior is normal. Judgment normal.   Diabetic foot exam: Normal inspection No skin breakdown No calluses  Normal DP pulses Normal sensation to light touch and monofilament Nails normal        Assessment & Plan:  The patient's preventative maintenance and recommended screening tests for an annual wellness exam were reviewed in full today. Brought up to date unless services declined.  Counselled on the importance of diet, exercise, and its role in overall health and mortality. The patient's FH and SH was reviewed, including their home life, tobacco status, and drug and alcohol status.    Prostate cancer  In Dad  At age 21.  Lab Results  Component Value Date   PSA 0.96 12/15/2015   PSA 0.79 03/08/2015   PSA 0.76 06/29/2014  Vaccines: Uptodate Colon cancer screening: colonoscopy 2016 tubular adenoma, Dr. Hilarie Fredrickson, repeat in 3 years. Nonsmoker  HIV:  Hep C: done

## 2015-12-20 NOTE — Patient Instructions (Addendum)
Start exercise as discussed. Stop at front desk to set up nutritionist. Check fasting blood sugar daily, goal < 120. Set yearly eye exam.

## 2016-01-03 ENCOUNTER — Other Ambulatory Visit: Payer: Self-pay | Admitting: Family Medicine

## 2016-02-17 ENCOUNTER — Encounter: Payer: BC Managed Care – PPO | Admitting: Family Medicine

## 2016-02-22 ENCOUNTER — Ambulatory Visit: Payer: BC Managed Care – PPO | Admitting: *Deleted

## 2016-03-23 ENCOUNTER — Ambulatory Visit: Payer: BC Managed Care – PPO | Admitting: Family Medicine

## 2016-04-28 ENCOUNTER — Telehealth: Payer: Self-pay | Admitting: Family Medicine

## 2016-04-28 DIAGNOSIS — E782 Mixed hyperlipidemia: Secondary | ICD-10-CM

## 2016-04-28 DIAGNOSIS — E1165 Type 2 diabetes mellitus with hyperglycemia: Secondary | ICD-10-CM

## 2016-04-28 NOTE — Telephone Encounter (Signed)
-----   Message from Marchia Bond sent at 04/21/2016 10:20 AM EDT ----- Regarding: 3 mo f/u labs Fri 10/13, need orders. Thanks! :-) Please order  future f/u labs for pt's upcoming lab appt. Thanks Aniceto Boss

## 2016-04-29 ENCOUNTER — Other Ambulatory Visit: Payer: Self-pay | Admitting: Family Medicine

## 2016-05-01 ENCOUNTER — Other Ambulatory Visit (INDEPENDENT_AMBULATORY_CARE_PROVIDER_SITE_OTHER): Payer: BC Managed Care – PPO

## 2016-05-01 DIAGNOSIS — E782 Mixed hyperlipidemia: Secondary | ICD-10-CM

## 2016-05-01 DIAGNOSIS — E1165 Type 2 diabetes mellitus with hyperglycemia: Secondary | ICD-10-CM

## 2016-05-01 LAB — LIPID PANEL
CHOL/HDL RATIO: 5
CHOLESTEROL: 169 mg/dL (ref 0–200)
HDL: 33.6 mg/dL — AB (ref >39.00)
Triglycerides: 505 mg/dL — ABNORMAL HIGH (ref 0.0–149.0)

## 2016-05-01 LAB — COMPREHENSIVE METABOLIC PANEL
ALBUMIN: 4.5 g/dL (ref 3.5–5.2)
ALK PHOS: 34 U/L — AB (ref 39–117)
ALT: 34 U/L (ref 0–53)
AST: 14 U/L (ref 0–37)
BILIRUBIN TOTAL: 1 mg/dL (ref 0.2–1.2)
BUN: 13 mg/dL (ref 6–23)
CALCIUM: 8.9 mg/dL (ref 8.4–10.5)
CO2: 27 mEq/L (ref 19–32)
Chloride: 101 mEq/L (ref 96–112)
Creatinine, Ser: 0.82 mg/dL (ref 0.40–1.50)
GFR: 104.78 mL/min (ref >60.00)
GLUCOSE: 276 mg/dL — AB (ref 70–99)
POTASSIUM: 4 meq/L (ref 3.5–5.1)
Sodium: 136 mEq/L (ref 135–145)
TOTAL PROTEIN: 6.9 g/dL (ref 6.0–8.3)

## 2016-05-01 LAB — HEMOGLOBIN A1C: Hgb A1c MFr Bld: 11.6 % — ABNORMAL HIGH (ref 4.6–6.5)

## 2016-05-01 LAB — LDL CHOLESTEROL, DIRECT: Direct LDL: 74 mg/dL

## 2016-05-05 ENCOUNTER — Ambulatory Visit: Payer: BC Managed Care – PPO | Admitting: Family Medicine

## 2016-05-18 ENCOUNTER — Ambulatory Visit (INDEPENDENT_AMBULATORY_CARE_PROVIDER_SITE_OTHER): Payer: BC Managed Care – PPO | Admitting: Family Medicine

## 2016-05-18 ENCOUNTER — Ambulatory Visit: Payer: BC Managed Care – PPO | Admitting: Family Medicine

## 2016-05-18 ENCOUNTER — Encounter: Payer: Self-pay | Admitting: Family Medicine

## 2016-05-18 ENCOUNTER — Telehealth: Payer: Self-pay | Admitting: *Deleted

## 2016-05-18 VITALS — BP 114/80 | HR 80 | Temp 98.3°F | Ht 69.5 in | Wt 190.5 lb

## 2016-05-18 DIAGNOSIS — E1165 Type 2 diabetes mellitus with hyperglycemia: Secondary | ICD-10-CM

## 2016-05-18 DIAGNOSIS — Z23 Encounter for immunization: Secondary | ICD-10-CM | POA: Diagnosis not present

## 2016-05-18 DIAGNOSIS — E782 Mixed hyperlipidemia: Secondary | ICD-10-CM

## 2016-05-18 LAB — HM DIABETES FOOT EXAM

## 2016-05-18 MED ORDER — METFORMIN HCL ER (MOD) 500 MG PO TB24: 1500.0000 mg | ORAL_TABLET | Freq: Every day | ORAL | 11 refills | Status: DC

## 2016-05-18 MED ORDER — FENOFIBRATE 160 MG PO TABS: 160.0000 mg | ORAL_TABLET | Freq: Every day | ORAL | 11 refills | Status: DC

## 2016-05-18 NOTE — Assessment & Plan Note (Signed)
Poor diet, limited activity.  Not taking medicaiton as directed.  Change back to long acting metformin  Given no diarrhea on this.  Try to increase to 3 tabs daily.

## 2016-05-18 NOTE — Telephone Encounter (Addendum)
Received fax from [facility] requesting PA for Metformin ER. PA completed on CoverMyMeds and approved from 05/18/2016-05/18/2017.  [facility] notified via fax.

## 2016-05-18 NOTE — Progress Notes (Signed)
50 year old male presents for follow up.  Hypertriglyceridemia: LDL at goal < 100, trig  Very elevated. On lipitor 40 mg daily. Lab Results  Component Value Date   CHOL 169 05/01/2016   HDL 33.60 (L) 05/01/2016   LDLCALC 73 09/28/2014   LDLDIRECT 74.0 05/01/2016   TRIG (H) 05/01/2016    505.0 Triglyceride is over 400; calculations on Lipids are invalid.   CHOLHDL 5 05/01/2016  No known SE to med, no myalgia.  Wt Readings from Last 3 Encounters:  05/18/16 190 lb 8 oz (86.4 kg)  12/20/15 193 lb 8 oz (87.8 kg)  08/06/15 197 lb 8 oz (89.6 kg)  Body mass index is 27.73 kg/m.  Diet:  Not working on low carb diet. Exercise: Occ  GERD controlled with prilosec 20 mg daily.  Diabetes: Poor control on metformin 1000 mg  BID daily. Some diarrhea as SE ( so did not always take second pill). ( Wants to get back on extended release metformin  At last OV referred to nutritionist, rec reg exercise.  he never went to nutritionist. Lab Results  Component Value Date   HGBA1C 11.6 (H) 05/01/2016   Using medications without difficulties: Hypoglycemic episodes:? Hyperglycemic episodes:? Feet problems: none Blood Sugars averaging: not checking. eye exam within last year: due  BP at goal.  BP Readings from Last 3 Encounters:  05/18/16 114/80  12/20/15 120/80  09/13/15 (!) 148/82     Social History /Family History/Past Medical History reviewed and updated if needed.  Review of Systems  Constitutional: Positive for fatigue. Negative for fever and unexpected weight change.  HENT: Negative for ear pain, congestion, sore throat, rhinorrhea, trouble swallowing and postnasal drip.  Eyes: Negative for pain.  Respiratory: Negative for cough, shortness of breath and wheezing.  Cardiovascular: Negative for chest pain, palpitations and leg swelling.  Gastrointestinal: Negative for nausea, abdominal pain, diarrhea, constipation and blood in stool.  Genitourinary: Negative for  dysuria, urgency, hematuria, discharge, penile swelling, scrotal swelling, difficulty urinating, penile pain and testicular pain.  Skin: Negative for rash.  Neurological: Negative for syncope, weakness, light-headedness, numbness and headaches.  Psychiatric/Behavioral: Positive for disturbed wake/sleep cycle. Negative for behavioral problems and dysphoric mood. The patient is not nervous/anxious.   Does not have time to sleep.      Objective:  Physical Exam  Constitutional: He appears well-developed and well-nourished. Non-toxic appearance. He does not appear ill. No distress.  HENT:  Head: Normocephalic and atraumatic.  Right Ear: Hearing, tympanic membrane, external ear and ear canal normal.  Left Ear: Hearing, tympanic membrane, external ear and ear canal normal.  Nose: Nose normal.  Mouth/Throat: Uvula is midline, oropharynx is clear and moist and mucous membranes are normal.  Eyes: Conjunctivae normal, EOM and lids are normal. Pupils are equal, round, and reactive to light. No foreign bodies found.  Neck: Trachea normal, normal range of motion and phonation normal. Neck supple. Carotid bruit is not present. No mass and no thyromegaly present.  Cardiovascular: Normal rate, regular rhythm, S1 normal, S2 normal, intact distal pulses and normal pulses. Exam reveals no gallop.  No murmur heard. Pulmonary/Chest: Breath sounds normal. He has no wheezes. He has no rhonchi. He has no rales.  Abdominal: Soft. Normal appearance and bowel sounds are normal. There is no hepatosplenomegaly. There is no tenderness. There is no rebound, no guarding and no CVA tenderness. No hernia. Hernia confirmed negative in the right inguinal area and confirmed negative in the left inguinal area.  Lymphadenopathy:  He has no cervical adenopathy.   Right: No inguinal adenopathy present.   Left: No inguinal adenopathy present.  Neurological: He is alert. He has normal strength  and normal reflexes. No cranial nerve deficit or sensory deficit. Gait normal.  Skin: Skin is warm, dry and intact. No rash noted.  Psychiatric: He has a normal mood and affect. His speech is normal and behavior is normal. Judgment normal.   Diabetic foot exam: Normal inspection No skin breakdown No calluses  Normal DP pulses Normal sensation to light touch and monofilament Nails normal

## 2016-05-18 NOTE — Progress Notes (Signed)
Pre visit review using our clinic review tool, if applicable. No additional management support is needed unless otherwise documented below in the visit note. 

## 2016-05-18 NOTE — Assessment & Plan Note (Signed)
LDL at goal on liptior. Add fenofibrate given risk of pancreatitis from trig > 500. If improved can consider staopping as pt wants to decrease meds as much as able.

## 2016-05-18 NOTE — Patient Instructions (Addendum)
Increase metformin to 3 tabs daily, back on long acting.  Start fenofibrate daily.  Work aggressively on weight loss, low carb diet and regular exercise. Call if  You need a referral to nutritionist again. Need yearly eye exam.

## 2016-08-09 ENCOUNTER — Telehealth: Payer: Self-pay | Admitting: Family Medicine

## 2016-08-09 DIAGNOSIS — E782 Mixed hyperlipidemia: Secondary | ICD-10-CM

## 2016-08-09 DIAGNOSIS — E1165 Type 2 diabetes mellitus with hyperglycemia: Secondary | ICD-10-CM

## 2016-08-09 NOTE — Telephone Encounter (Signed)
-----   Message from Ellamae Sia sent at 08/08/2016  4:17 PM EST ----- Regarding: lab orders for Friday,2.2.18 Lab orders for f/u

## 2016-08-18 ENCOUNTER — Other Ambulatory Visit: Payer: BC Managed Care – PPO

## 2016-08-22 ENCOUNTER — Ambulatory Visit: Payer: BC Managed Care – PPO | Admitting: Family Medicine

## 2016-10-06 ENCOUNTER — Encounter (INDEPENDENT_AMBULATORY_CARE_PROVIDER_SITE_OTHER): Payer: Self-pay

## 2016-10-06 ENCOUNTER — Other Ambulatory Visit (INDEPENDENT_AMBULATORY_CARE_PROVIDER_SITE_OTHER): Payer: BC Managed Care – PPO

## 2016-10-06 DIAGNOSIS — E1165 Type 2 diabetes mellitus with hyperglycemia: Secondary | ICD-10-CM | POA: Diagnosis not present

## 2016-10-06 DIAGNOSIS — E782 Mixed hyperlipidemia: Secondary | ICD-10-CM | POA: Diagnosis not present

## 2016-10-06 LAB — LIPID PANEL
CHOL/HDL RATIO: 4
Cholesterol: 153 mg/dL (ref 0–200)
HDL: 36.1 mg/dL — ABNORMAL LOW (ref >39.00)
LDL Cholesterol: 94 mg/dL (ref 0–99)
NonHDL: 117.26
TRIGLYCERIDES: 118 mg/dL (ref 0.0–149.0)
VLDL: 23.6 mg/dL (ref 0.0–40.0)

## 2016-10-06 LAB — COMPREHENSIVE METABOLIC PANEL
ALT: 36 U/L (ref 0–53)
AST: 18 U/L (ref 0–37)
Albumin: 4.4 g/dL (ref 3.5–5.2)
Alkaline Phosphatase: 26 U/L — ABNORMAL LOW (ref 39–117)
BILIRUBIN TOTAL: 0.7 mg/dL (ref 0.2–1.2)
BUN: 13 mg/dL (ref 6–23)
CALCIUM: 9.1 mg/dL (ref 8.4–10.5)
CO2: 27 meq/L (ref 19–32)
CREATININE: 0.84 mg/dL (ref 0.40–1.50)
Chloride: 106 mEq/L (ref 96–112)
GFR: 101.74 mL/min (ref >60.00)
GLUCOSE: 196 mg/dL — AB (ref 70–99)
Potassium: 4.2 mEq/L (ref 3.5–5.1)
SODIUM: 140 meq/L (ref 135–145)
Total Protein: 6.8 g/dL (ref 6.0–8.3)

## 2016-10-06 LAB — HEMOGLOBIN A1C: Hgb A1c MFr Bld: 10 % — ABNORMAL HIGH (ref 4.6–6.5)

## 2016-10-10 ENCOUNTER — Ambulatory Visit: Payer: BC Managed Care – PPO | Admitting: Family Medicine

## 2016-10-12 ENCOUNTER — Encounter: Payer: Self-pay | Admitting: Family Medicine

## 2016-10-12 ENCOUNTER — Ambulatory Visit (INDEPENDENT_AMBULATORY_CARE_PROVIDER_SITE_OTHER): Payer: BC Managed Care – PPO | Admitting: Family Medicine

## 2016-10-12 VITALS — BP 134/90 | HR 77 | Temp 98.2°F | Ht 69.5 in | Wt 189.0 lb

## 2016-10-12 DIAGNOSIS — E1165 Type 2 diabetes mellitus with hyperglycemia: Secondary | ICD-10-CM

## 2016-10-12 DIAGNOSIS — E782 Mixed hyperlipidemia: Secondary | ICD-10-CM | POA: Diagnosis not present

## 2016-10-12 NOTE — Assessment & Plan Note (Signed)
LDL at goal, and trig dramatically improved on current meds. Continue.

## 2016-10-12 NOTE — Patient Instructions (Signed)
Keep up great work on healthy eating and exercise.  Start checking sugar in morning fasting.. Record. Check occ 2 hours after meals.

## 2016-10-12 NOTE — Assessment & Plan Note (Signed)
Excellent improvement with lifestyle and with increase in metformin. Continue current plan. Follow up in 3 months.

## 2016-10-12 NOTE — Progress Notes (Signed)
   Subjective:    Patient ID: Joseph Lam, male    DOB: Nov 04, 1965, 51 y.o.   MRN: [id number]  HPI    51 year old male presents for DM follow up.   Diabetes:   Poor control but improving .Marland Kitchen Now on metfromin increased to 3 tabs daily. Long acting. Lab Results  Component Value Date   HGBA1C 10.0 (H) 10/06/2016  Using medications without difficulties: off and on diarrhea Hypoglycemic episodes:? Hyperglycemic episodes:? Feet problems: no ulcer Blood Sugars averaging: has not started checking sugars.. Need new battery.  He has been going to gym 5-7 days a week. 30 cardio, 30 min on weights.  He has eliminated carbs in diet. eye exam within last year:  Wt Readings from Last 3 Encounters:  10/12/16 189 lb (85.7 kg)  05/18/16 190 lb 8 oz (86.4 kg)  12/20/15 193 lb 8 oz (87.8 kg)    Elevated Cholesterol: LDL now at goal < 100.  Currently on lipitor  40 and fenofibrate   Trig.. Down from 505 to 118. Lab Results  Component Value Date   CHOL 153 10/06/2016   HDL 36.10 (L) 10/06/2016   LDLCALC 94 10/06/2016   LDLDIRECT 74.0 05/01/2016   TRIG 118.0 10/06/2016   CHOLHDL 4 10/06/2016   No chest pain, no SOB, no edema. Using medications without problems: No  Muscle aches:  none    Blood pressure 134/90, pulse 77, temperature 98.2 F (36.8 C), temperature source Oral, height 5' 9.5" (1.765 m), weight 189 lb (85.7 kg), SpO2 98 %.   Review of Systems  Constitutional: Negative for fatigue.  HENT: Negative for ear pain.   Eyes: Negative for pain.  Respiratory: Negative for cough.   Cardiovascular: Negative for chest pain and leg swelling.       Objective:   Physical Exam  Constitutional: Vital signs are normal. He appears well-developed and well-nourished.  HENT:  Head: Normocephalic.  Right Ear: Hearing normal.  Left Ear: Hearing normal.  Nose: Nose normal.  Mouth/Throat: Oropharynx is clear and moist and mucous membranes are normal.  Neck: Trachea normal. Carotid bruit  is not present. No thyroid mass and no thyromegaly present.  Cardiovascular: Normal rate, regular rhythm and normal pulses.  Exam reveals no gallop, no distant heart sounds and no friction rub.   No murmur heard. No peripheral edema  Pulmonary/Chest: Effort normal and breath sounds normal. No respiratory distress.  Skin: Skin is warm, dry and intact. No rash noted.  Psychiatric: He has a normal mood and affect. His speech is normal and behavior is normal. Thought content normal.   Diabetic foot exam: Normal inspection No skin breakdown No calluses  Normal DP pulses Normal sensation to light touch and monofilament Nails  Discolored.       Assessment & Plan:

## 2016-10-12 NOTE — Progress Notes (Signed)
Pre visit review using our clinic review tool, if applicable. No additional management support is needed unless otherwise documented below in the visit note. 

## 2017-01-07 ENCOUNTER — Telehealth: Payer: Self-pay | Admitting: Family Medicine

## 2017-01-07 DIAGNOSIS — E1165 Type 2 diabetes mellitus with hyperglycemia: Secondary | ICD-10-CM

## 2017-01-07 NOTE — Telephone Encounter (Signed)
-----   Message from Marchia Bond sent at 12/29/2016  2:20 PM EDT ----- Regarding: dm f/u labs Mon 6/25, need orders. Thanks! Please order future dm f/u labs for pt's upcoming lab appt. Thanks Aniceto Boss

## 2017-01-08 ENCOUNTER — Other Ambulatory Visit: Payer: BC Managed Care – PPO

## 2017-01-12 ENCOUNTER — Ambulatory Visit: Payer: BC Managed Care – PPO | Admitting: Family Medicine

## 2017-01-27 ENCOUNTER — Other Ambulatory Visit: Payer: Self-pay | Admitting: Family Medicine

## 2017-01-31 ENCOUNTER — Ambulatory Visit (INDEPENDENT_AMBULATORY_CARE_PROVIDER_SITE_OTHER): Payer: BC Managed Care – PPO | Admitting: Podiatry

## 2017-01-31 DIAGNOSIS — M722 Plantar fascial fibromatosis: Secondary | ICD-10-CM | POA: Diagnosis not present

## 2017-01-31 NOTE — Progress Notes (Signed)
Patient came in today for casting for new F/O.  He has really enjoyed the ones dispensed in 2017.  Since patient has not reported any change in foot dynamics, and the F/O seem to be working well, Richie will fab another pair off previous cast.   Additional FF padding will be added.

## 2017-02-21 ENCOUNTER — Encounter: Payer: BC Managed Care – PPO | Admitting: Orthotics

## 2017-03-14 ENCOUNTER — Ambulatory Visit: Payer: BC Managed Care – PPO | Admitting: Orthotics

## 2017-03-14 DIAGNOSIS — M722 Plantar fascial fibromatosis: Secondary | ICD-10-CM

## 2017-03-14 NOTE — Progress Notes (Signed)
Patient seen today for adjustment of CMFO..needs 1/8" lateral wedge for F/O..To be adjusted in [place]

## 2017-03-19 ENCOUNTER — Other Ambulatory Visit: Payer: Self-pay | Admitting: Family Medicine

## 2017-03-21 ENCOUNTER — Ambulatory Visit: Payer: BC Managed Care – PPO | Admitting: Orthotics

## 2017-03-21 DIAGNOSIS — M722 Plantar fascial fibromatosis: Secondary | ICD-10-CM

## 2017-03-21 NOTE — Progress Notes (Signed)
Patient pleased with adjustments.

## 2017-04-19 ENCOUNTER — Other Ambulatory Visit: Payer: Self-pay | Admitting: Family Medicine

## 2017-05-06 ENCOUNTER — Other Ambulatory Visit: Payer: Self-pay | Admitting: Family Medicine

## 2017-05-17 ENCOUNTER — Other Ambulatory Visit: Payer: Self-pay | Admitting: Family Medicine

## 2017-05-22 ENCOUNTER — Ambulatory Visit: Payer: BC Managed Care – PPO | Admitting: Family Medicine

## 2017-05-22 ENCOUNTER — Encounter: Payer: Self-pay | Admitting: Family Medicine

## 2017-05-22 VITALS — BP 120/80 | HR 86 | Temp 98.4°F | Ht 69.5 in | Wt 187.2 lb

## 2017-05-22 DIAGNOSIS — E782 Mixed hyperlipidemia: Secondary | ICD-10-CM | POA: Diagnosis not present

## 2017-05-22 DIAGNOSIS — Z23 Encounter for immunization: Secondary | ICD-10-CM

## 2017-05-22 DIAGNOSIS — E1165 Type 2 diabetes mellitus with hyperglycemia: Secondary | ICD-10-CM

## 2017-05-22 NOTE — Progress Notes (Signed)
   Subjective:    Patient ID: Joseph Lam, male    DOB: Mar 13, 1966, 51 y.o.   MRN: [id number]  HPI    51 year old male presents for DM follow up.  Diabetes:   Due for re-eval.  He has not been sticking to diet as weel. Lab Results  Component Value Date   HGBA1C 10.0 (H) 10/06/2016  Using medications without difficulties: Hypoglycemic episodes:? Hyperglycemic episodes:? Feet problems: none Blood Sugars averaging: not checking blood suger recently eye exam within last year: ovedue Wt Readings from Last 3 Encounters:  05/22/17 187 lb 4 oz (84.9 kg)  10/12/16 189 lb (85.7 kg)  05/18/16 190 lb 8 oz (86.4 kg)   Elevated Cholesterol: At goal last check. On staitn and fenofibrate Using medications without problems: none Muscle aches:  none Diet compliance: poor lately Exercise: minimal Other complaints:  Blood pressure 120/80, pulse 86, temperature 98.4 F (36.9 C), temperature source Oral, height 5' 9.5" (1.765 m), weight 187 lb 4 oz (84.9 kg), SpO2 97 %.  Review of Systems  Constitutional: Negative for fatigue and fever.  HENT: Negative for ear pain.   Eyes: Negative for pain.  Respiratory: Negative for cough and shortness of breath.   Cardiovascular: Negative for chest pain, palpitations and leg swelling.  Gastrointestinal: Negative for abdominal pain.  Genitourinary: Negative for dysuria.  Musculoskeletal: Negative for arthralgias.  Neurological: Negative for syncope, light-headedness and headaches.  Psychiatric/Behavioral: Negative for dysphoric mood.       Objective:   Physical Exam  Constitutional: Vital signs are normal. He appears well-developed and well-nourished.  HENT:  Head: Normocephalic.  Right Ear: Hearing normal.  Left Ear: Hearing normal.  Nose: Nose normal.  Mouth/Throat: Oropharynx is clear and moist and mucous membranes are normal.  Neck: Trachea normal. Carotid bruit is not present. No thyroid mass and no thyromegaly present.  Cardiovascular:  Normal rate, regular rhythm and normal pulses. Exam reveals no gallop, no distant heart sounds and no friction rub.  No murmur heard. No peripheral edema  Pulmonary/Chest: Effort normal and breath sounds normal. No respiratory distress.  Skin: Skin is warm, dry and intact. No rash noted.  Psychiatric: He has a normal mood and affect. His speech is normal and behavior is normal. Thought content normal.     Diabetic foot exam: Normal inspection No skin breakdown No calluses  Normal DP pulses Normal sensation to light touch and monofilament Nails normal      Assessment & Plan:

## 2017-05-22 NOTE — Addendum Note (Signed)
Addended by: Elmon Kirschner A on: 05/22/2017 12:48 PM   Modules accepted: Orders

## 2017-05-22 NOTE — Assessment & Plan Note (Signed)
Due for re-eval.  On statin and fenofibrate.

## 2017-05-22 NOTE — Patient Instructions (Addendum)
Schedule lab only appt for chol and DM check ASAP. Set up yearly eye exam.

## 2017-05-22 NOTE — Assessment & Plan Note (Signed)
Inadequate control likely, due for lab re-eval.  Work on low car diet, exercise and weight loss.

## 2017-05-28 ENCOUNTER — Other Ambulatory Visit (INDEPENDENT_AMBULATORY_CARE_PROVIDER_SITE_OTHER): Payer: BC Managed Care – PPO

## 2017-05-28 DIAGNOSIS — E782 Mixed hyperlipidemia: Secondary | ICD-10-CM | POA: Diagnosis not present

## 2017-05-28 DIAGNOSIS — E1165 Type 2 diabetes mellitus with hyperglycemia: Secondary | ICD-10-CM

## 2017-05-28 LAB — COMPREHENSIVE METABOLIC PANEL
ALT: 49 U/L (ref 0–53)
AST: 26 U/L (ref 0–37)
Albumin: 4.6 g/dL (ref 3.5–5.2)
Alkaline Phosphatase: 33 U/L — ABNORMAL LOW (ref 39–117)
BILIRUBIN TOTAL: 0.9 mg/dL (ref 0.2–1.2)
BUN: 12 mg/dL (ref 6–23)
CALCIUM: 9.4 mg/dL (ref 8.4–10.5)
CO2: 27 mEq/L (ref 19–32)
Chloride: 102 mEq/L (ref 96–112)
Creatinine, Ser: 0.82 mg/dL (ref 0.40–1.50)
GFR: 104.35 mL/min (ref >60.00)
GLUCOSE: 252 mg/dL — AB (ref 70–99)
Potassium: 4.1 mEq/L (ref 3.5–5.1)
Sodium: 137 mEq/L (ref 135–145)
TOTAL PROTEIN: 7.2 g/dL (ref 6.0–8.3)

## 2017-05-28 LAB — LIPID PANEL
Cholesterol: 166 mg/dL (ref 0–200)
HDL: 39.1 mg/dL (ref >39.00)
LDL Cholesterol: 91 mg/dL (ref 0–99)
NONHDL: 126.47
TRIGLYCERIDES: 177 mg/dL — AB (ref 0.0–149.0)
Total CHOL/HDL Ratio: 4
VLDL: 35.4 mg/dL (ref 0.0–40.0)

## 2017-05-28 LAB — MICROALBUMIN / CREATININE URINE RATIO
Creatinine,U: 67.4 mg/dL
Microalb Creat Ratio: 1 mg/g (ref 0.0–30.0)
Microalb, Ur: <0.7 mg/dL (ref 0.0–1.9)

## 2017-05-28 LAB — HEMOGLOBIN A1C: Hgb A1c MFr Bld: 11.5 % — ABNORMAL HIGH (ref 4.6–6.5)

## 2017-06-02 ENCOUNTER — Other Ambulatory Visit: Payer: Self-pay | Admitting: Family Medicine

## 2017-06-27 ENCOUNTER — Other Ambulatory Visit: Payer: Self-pay | Admitting: Family Medicine

## 2017-07-19 ENCOUNTER — Telehealth: Payer: Self-pay | Admitting: Family Medicine

## 2017-07-19 NOTE — Telephone Encounter (Unsigned)
Copied from [place] [id number]. Topic: Appointment Scheduling - Scheduling Inquiry for Clinic >> Jul 19, 2017  4:01 PM Boyd Kerbs wrote: Reason for CRM: Will need labs done and needs order put in and scheduled

## 2017-09-11 ENCOUNTER — Encounter: Payer: Self-pay | Admitting: Family Medicine

## 2017-09-11 ENCOUNTER — Other Ambulatory Visit: Payer: Self-pay

## 2017-09-11 ENCOUNTER — Other Ambulatory Visit: Payer: Self-pay | Admitting: Family Medicine

## 2017-09-11 ENCOUNTER — Ambulatory Visit: Payer: BC Managed Care – PPO | Admitting: Family Medicine

## 2017-09-11 ENCOUNTER — Telehealth: Payer: Self-pay | Admitting: Family Medicine

## 2017-09-11 VITALS — BP 110/76 | HR 78 | Temp 98.3°F | Ht 69.25 in | Wt 189.5 lb

## 2017-09-11 DIAGNOSIS — Z125 Encounter for screening for malignant neoplasm of prostate: Secondary | ICD-10-CM | POA: Diagnosis not present

## 2017-09-11 DIAGNOSIS — E1165 Type 2 diabetes mellitus with hyperglycemia: Secondary | ICD-10-CM | POA: Diagnosis not present

## 2017-09-11 DIAGNOSIS — E782 Mixed hyperlipidemia: Secondary | ICD-10-CM | POA: Diagnosis not present

## 2017-09-11 DIAGNOSIS — Z0000 Encounter for general adult medical examination without abnormal findings: Secondary | ICD-10-CM

## 2017-09-11 LAB — PSA: PSA: 1.28 ng/mL (ref 0.10–4.00)

## 2017-09-11 LAB — HEMOGLOBIN A1C: HEMOGLOBIN A1C: 11.8 % — AB (ref 4.6–6.5)

## 2017-09-11 LAB — HM DIABETES FOOT EXAM

## 2017-09-11 MED ORDER — GLIPIZIDE ER 10 MG PO TB24: 10.0000 mg | ORAL_TABLET | Freq: Every day | ORAL | 11 refills | Status: DC

## 2017-09-11 MED ORDER — METFORMIN HCL ER (MOD) 500 MG PO TB24: 2000.0000 mg | ORAL_TABLET | Freq: Every day | ORAL | 5 refills | Status: DC

## 2017-09-11 NOTE — Progress Notes (Signed)
Subjective:    Patient ID: Joseph Lam, male    DOB: 1966-02-06, 52 y.o.   MRN: [id number]  HPI The patient is here for annual wellness exam and preventative care.    Diabetes:  Due for re-eval.  Now on 2000 mg daily of metformin.  No SE.  Consider next med.. As glizipnde.  Lab Results  Component Value Date   HGBA1C 11.5 (H) 05/28/2017  Using medications without difficulties: Hypoglycemic episodes: none Hyperglycemic episodes: possible high given some  Feet problems: no ulcer Blood Sugars averaging:   Meter not working currently. eye exam within last year: none  Elevated Cholesterol:  Previously well controlled on  lipitor 40 mg and fenofibrate Lab Results  Component Value Date   CHOL 166 05/28/2017   HDL 39.10 05/28/2017   LDLCALC 91 05/28/2017   LDLDIRECT 74.0 05/01/2016   TRIG 177.0 (H) 05/28/2017   CHOLHDL 4 05/28/2017  Using medications without problems: Muscle aches:  Diet compliance: moderate.. Some good days and bad. Exercise: started 2 months ago of 4-6 days a week. Other complaints:   Wt Readings from Last 3 Encounters:  09/11/17 189 lb 8 oz (86 kg)  05/22/17 187 lb 4 oz (84.9 kg)  10/12/16 189 lb (85.7 kg)     Social History /Family History/Past Medical History reviewed in detail and updated in EMR if needed. Blood pressure 110/76, pulse 78, temperature 98.3 F (36.8 C), temperature source Oral, height 5' 9.25" (1.759 m), weight 189 lb 8 oz (86 kg).   Review of Systems  Constitutional: Negative for fatigue and fever.  HENT: Negative for ear pain.   Eyes: Negative for pain.  Respiratory: Negative for cough and shortness of breath.   Cardiovascular: Negative for chest pain, palpitations and leg swelling.  Gastrointestinal: Negative for abdominal pain.  Genitourinary: Negative for dysuria.  Musculoskeletal: Negative for arthralgias.  Neurological: Negative for syncope, light-headedness and headaches.  Psychiatric/Behavioral: Negative for dysphoric  mood.       Objective:   Physical Exam  Constitutional: He appears well-developed and well-nourished.  Non-toxic appearance. He does not appear ill. No distress.  HENT:  Head: Normocephalic and atraumatic.  Right Ear: Hearing, tympanic membrane, external ear and ear canal normal.  Left Ear: Hearing, tympanic membrane, external ear and ear canal normal.  Nose: Nose normal.  Mouth/Throat: Uvula is midline, oropharynx is clear and moist and mucous membranes are normal.  Eyes: Conjunctivae, EOM and lids are normal. Pupils are equal, round, and reactive to light. Lids are everted and swept, no foreign bodies found.  Neck: Trachea normal, normal range of motion and phonation normal. Neck supple. Carotid bruit is not present. No thyroid mass and no thyromegaly present.  Cardiovascular: Normal rate, regular rhythm, S1 normal, S2 normal, intact distal pulses and normal pulses. Exam reveals no gallop.  No murmur heard. Pulmonary/Chest: Breath sounds normal. He has no wheezes. He has no rhonchi. He has no rales.  Abdominal: Soft. Normal appearance and bowel sounds are normal. There is no hepatosplenomegaly. There is no tenderness. There is no rebound, no guarding and no CVA tenderness. No hernia.  Lymphadenopathy:    He has no cervical adenopathy.  Neurological: He is alert. He has normal strength and normal reflexes. No cranial nerve deficit or sensory deficit. Gait normal.  Skin: Skin is warm, dry and intact. No rash noted.  Psychiatric: He has a normal mood and affect. His speech is normal and behavior is normal. Judgment normal.     Diabetic foot  exam: Normal inspection No skin breakdown No calluses  Normal DP pulses Normal sensation to light touch and monofilament Nails normal      Assessment & Plan:  The patient's preventative maintenance and recommended screening tests for an annual wellness exam were reviewed in full today. Brought up to date unless services  declined.  Counselled on the importance of diet, exercise, and its role in overall health and mortality. The patient's FH and SH was reviewed, including their home life, tobacco status, and drug and alcohol status.   Prostate cancer  In Dad  At age 59.  Lab Results  Component Value Date   PSA 0.96 12/15/2015   PSA 0.79 03/08/2015   PSA 0.76 06/29/2014  Vaccines: Uptodate Colon cancer screening: colonoscopy 2016 tubular adenoma, Dr. Hilarie Fredrickson, repeat in 3 years. Nonsmoker  HIV: refused. Hep C: done

## 2017-09-11 NOTE — Assessment & Plan Note (Signed)
Previously at goal on current meds.

## 2017-09-11 NOTE — Patient Instructions (Addendum)
Set up eye doctor exam.  Please stop at the lab to have labs drawn.  Plan colonoscopy repeat after 10/06/2017.Marland Kitchen If Dr. Hilarie Fredrickson does not call.. Call them.

## 2017-09-11 NOTE — Assessment & Plan Note (Signed)
Due for re-eval. Dicsused options for further treatment. Pt wants to avoid injectable meds.  Plan continue metformin and trial of glipizide if A1C > 7. If not effective will try [place] etc. Encouraged exercise, weight loss, healthy eating habits.

## 2017-09-12 NOTE — Telephone Encounter (Signed)
labs

## 2017-09-28 ENCOUNTER — Encounter: Payer: Self-pay | Admitting: *Deleted

## 2017-10-02 ENCOUNTER — Ambulatory Visit: Payer: BC Managed Care – PPO | Admitting: Family Medicine

## 2017-10-02 ENCOUNTER — Encounter: Payer: Self-pay | Admitting: Family Medicine

## 2017-10-02 ENCOUNTER — Other Ambulatory Visit: Payer: Self-pay

## 2017-10-02 VITALS — BP 124/82 | HR 77 | Temp 97.7°F | Ht 69.25 in | Wt 193.5 lb

## 2017-10-02 DIAGNOSIS — E1165 Type 2 diabetes mellitus with hyperglycemia: Secondary | ICD-10-CM

## 2017-10-02 MED ORDER — SITAGLIPTIN PHOSPHATE 100 MG PO TABS: 100.0000 mg | ORAL_TABLET | Freq: Every day | ORAL | 11 refills | Status: DC

## 2017-10-02 NOTE — Patient Instructions (Addendum)
Continue metformin and glipizide at max doses. Try to take regularly Start januvia 100 mg  Daily.  Follow blood sugars at home.. Gaol FBS < 120 and 2 hours after meals < 180.

## 2017-10-02 NOTE — Progress Notes (Signed)
   Subjective:    Patient ID: Joseph Lam, male    DOB: 1966-03-30, 52 y.o.   MRN: [id number]  HPI    52 year old male presents for follow up poorly controlled DM  Diabetes:   At last OV/labs he was started on glipizide 10 mg in addition to max metformin.  He was not interested in injectable medication.  He has had a few days he stopped the new med and metformin given upset stomach. Occ forgots  Lab Results  Component Value Date   HGBA1C 11.8 (H) 09/11/2017  Using medications without difficulties: none Hypoglycemic episodes: none Hyperglycemic episodes:  Frequently after meals. Feet problems: none Blood Sugars averaging: FBS 159- 173-217 eye exam within last year:    Has been working on low carb diet, exercise: elliptical 3-5 days a week.   Blood pressure 124/82, pulse 77, temperature 97.7 F (36.5 C), temperature source Oral, height 5' 9.25" (1.759 m), weight 193 lb 8 oz (87.8 kg).  Review of Systems  Constitutional: Negative for fatigue and fever.  HENT: Negative for ear pain.   Eyes: Negative for pain.  Respiratory: Negative for cough and shortness of breath.   Cardiovascular: Negative for chest pain, palpitations and leg swelling.  Gastrointestinal: Positive for nausea. Negative for abdominal pain.  Genitourinary: Negative for dysuria.  Musculoskeletal: Negative for arthralgias.  Neurological: Negative for syncope, light-headedness and headaches.  Psychiatric/Behavioral: Negative for dysphoric mood.       Objective:   Physical Exam  Constitutional: Vital signs are normal. He appears well-developed and well-nourished.  Central obesity  HENT:  Head: Normocephalic.  Right Ear: Hearing normal.  Left Ear: Hearing normal.  Nose: Nose normal.  Mouth/Throat: Oropharynx is clear and moist and mucous membranes are normal.  Neck: Trachea normal. Carotid bruit is not present. No thyroid mass and no thyromegaly present.  Cardiovascular: Normal rate, regular rhythm and  normal pulses. Exam reveals no gallop, no distant heart sounds and no friction rub.  No murmur heard. No peripheral edema  Pulmonary/Chest: Effort normal and breath sounds normal. No respiratory distress.  Skin: Skin is warm, dry and intact. No rash noted.  Psychiatric: He has a normal mood and affect. His speech is normal and behavior is normal. Thought content normal.          Assessment & Plan:

## 2017-10-02 NOTE — Assessment & Plan Note (Signed)
Some improvement in control with addition of glipizide.. But remains far from goal. Add [place].  Discussed insulin instead pt will start [place] but consider insulin.

## 2017-10-08 ENCOUNTER — Encounter: Payer: Self-pay | Admitting: Internal Medicine

## 2017-11-06 ENCOUNTER — Other Ambulatory Visit: Payer: Self-pay | Admitting: Family Medicine

## 2017-12-04 ENCOUNTER — Other Ambulatory Visit: Payer: BC Managed Care – PPO

## 2017-12-08 ENCOUNTER — Other Ambulatory Visit: Payer: Self-pay | Admitting: Family Medicine

## 2017-12-11 ENCOUNTER — Ambulatory Visit: Payer: BC Managed Care – PPO | Admitting: Family Medicine

## 2017-12-14 DIAGNOSIS — H524 Presbyopia: Secondary | ICD-10-CM

## 2017-12-14 DIAGNOSIS — H52203 Unspecified astigmatism, bilateral: Secondary | ICD-10-CM

## 2017-12-14 DIAGNOSIS — H5213 Myopia, bilateral: Secondary | ICD-10-CM | POA: Insufficient documentation

## 2017-12-14 LAB — HM DIABETES EYE EXAM

## 2017-12-25 ENCOUNTER — Telehealth: Payer: Self-pay | Admitting: Family Medicine

## 2017-12-25 ENCOUNTER — Other Ambulatory Visit: Payer: BC Managed Care – PPO

## 2017-12-25 DIAGNOSIS — E1165 Type 2 diabetes mellitus with hyperglycemia: Secondary | ICD-10-CM

## 2017-12-25 NOTE — Telephone Encounter (Signed)
-----   Message from Ellamae Sia sent at 12/18/2017  2:44 PM EDT ----- Regarding: Lab orders for Tuesday, 6.11.19 Lab orders for a 3 month follow up appt.

## 2017-12-27 ENCOUNTER — Other Ambulatory Visit (INDEPENDENT_AMBULATORY_CARE_PROVIDER_SITE_OTHER): Payer: BC Managed Care – PPO

## 2017-12-27 DIAGNOSIS — E1165 Type 2 diabetes mellitus with hyperglycemia: Secondary | ICD-10-CM | POA: Diagnosis not present

## 2017-12-27 LAB — COMPREHENSIVE METABOLIC PANEL
ALT: 44 U/L (ref 0–53)
AST: 24 U/L (ref 0–37)
Albumin: 4.5 g/dL (ref 3.5–5.2)
Alkaline Phosphatase: 30 U/L — ABNORMAL LOW (ref 39–117)
BILIRUBIN TOTAL: 0.5 mg/dL (ref 0.2–1.2)
BUN: 17 mg/dL (ref 6–23)
CO2: 25 meq/L (ref 19–32)
CREATININE: 0.93 mg/dL (ref 0.40–1.50)
Calcium: 9.3 mg/dL (ref 8.4–10.5)
Chloride: 106 mEq/L (ref 96–112)
GFR: 90.04 mL/min (ref >60.00)
GLUCOSE: 124 mg/dL — AB (ref 70–99)
Potassium: 3.8 mEq/L (ref 3.5–5.1)
Sodium: 139 mEq/L (ref 135–145)
Total Protein: 7.1 g/dL (ref 6.0–8.3)

## 2017-12-27 LAB — LIPID PANEL
Cholesterol: 164 mg/dL (ref 0–200)
HDL: 39.6 mg/dL (ref >39.00)
NONHDL: 123.91
Total CHOL/HDL Ratio: 4
Triglycerides: 285 mg/dL — ABNORMAL HIGH (ref 0.0–149.0)
VLDL: 57 mg/dL — ABNORMAL HIGH (ref 0.0–40.0)

## 2017-12-27 LAB — LDL CHOLESTEROL, DIRECT: Direct LDL: 96 mg/dL

## 2017-12-27 LAB — HEMOGLOBIN A1C: Hgb A1c MFr Bld: 9.5 % — ABNORMAL HIGH (ref 4.6–6.5)

## 2018-01-01 ENCOUNTER — Ambulatory Visit: Payer: BC Managed Care – PPO | Admitting: Family Medicine

## 2018-01-18 ENCOUNTER — Encounter: Payer: Self-pay | Admitting: Family Medicine

## 2018-01-18 ENCOUNTER — Ambulatory Visit: Payer: BC Managed Care – PPO | Admitting: Family Medicine

## 2018-01-18 VITALS — BP 110/70 | HR 79 | Temp 98.3°F | Ht 69.25 in | Wt 193.5 lb

## 2018-01-18 DIAGNOSIS — L719 Rosacea, unspecified: Secondary | ICD-10-CM

## 2018-01-18 DIAGNOSIS — E782 Mixed hyperlipidemia: Secondary | ICD-10-CM

## 2018-01-18 DIAGNOSIS — E1165 Type 2 diabetes mellitus with hyperglycemia: Secondary | ICD-10-CM | POA: Diagnosis not present

## 2018-01-18 LAB — HM DIABETES FOOT EXAM

## 2018-01-18 MED ORDER — METRONIDAZOLE 1 % EX GEL: Freq: Every day | CUTANEOUS | 0 refills | Status: DC

## 2018-01-18 NOTE — Assessment & Plan Note (Signed)
Improved control with addition of januvia. Restart glipizide and continue metformin.  Encouraged exercise, weight loss, healthy eating habits.

## 2018-01-18 NOTE — Assessment & Plan Note (Signed)
Well controlled. Continue current medication.  

## 2018-01-18 NOTE — Assessment & Plan Note (Signed)
Treat with topical metronidazole. Not clearly related to glipizide so restart.

## 2018-01-18 NOTE — Progress Notes (Signed)
   Subjective:    Patient ID: Joseph Lam, male    DOB: Feb 15, 1966, 52 y.o.   MRN: [id number]  HPI   52 year old male presents for 3 month follow up.  Diabetes:    Has dropped from 11.8  to 9.5. On max metformin, added januvia at last OV ( NO SE) .Marland Kitchen He has stopped glipizide ( had red rash across face.. Improve off this med) Lab Results  Component Value Date   HGBA1C 9.5 (H) 12/27/2017  Using medications without difficulties: Hypoglycemic episodes: Hyperglycemic episodes: Feet problems:none Blood Sugars averaging: Has not been checking blood sugars. eye exam within last year: yes  Elevated Cholesterol:  LDL At goal on  lipitor and fenofibrate. trgs remain high given poor DM control. Lab Results  Component Value Date   CHOL 164 12/27/2017   HDL 39.60 12/27/2017   LDLCALC 91 05/28/2017   LDLDIRECT 96.0 12/27/2017   TRIG 285.0 (H) 12/27/2017   CHOLHDL 4 12/27/2017  Using medications without problems: none Muscle aches: none Diet compliance: Has been working on low carb diet. Exercise: minimal.. Can do better. Has been working a lot Other complaints:  Wt Readings from Last 3 Encounters:  01/18/18 193 lb 8 oz (87.8 kg)  10/02/17 193 lb 8 oz (87.8 kg)  09/11/17 189 lb 8 oz (86 kg)   Blood pressure 110/70, pulse 79, temperature 98.3 F (36.8 C), temperature source Oral, height 5' 9.25" (1.759 m), weight 193 lb 8 oz (87.8 kg).    Review of Systems  Constitutional: Negative for fatigue and fever.  HENT: Negative for ear pain.   Eyes: Negative for pain.  Respiratory: Negative for cough and shortness of breath.   Cardiovascular: Negative for chest pain, palpitations and leg swelling.  Gastrointestinal: Negative for abdominal pain.  Genitourinary: Negative for dysuria.  Musculoskeletal: Negative for arthralgias.  Neurological: Negative for syncope, light-headedness and headaches.  Psychiatric/Behavioral: Negative for dysphoric mood.       Objective:   Physical Exam    Constitutional: Vital signs are normal. He appears well-developed and well-nourished.  HENT:  Head: Normocephalic.  Right Ear: Hearing normal.  Left Ear: Hearing normal.  Nose: Nose normal.  Mouth/Throat: Oropharynx is clear and moist and mucous membranes are normal.  Neck: Trachea normal. Carotid bruit is not present. No thyroid mass and no thyromegaly present.  Cardiovascular: Normal rate, regular rhythm and normal pulses. Exam reveals no gallop, no distant heart sounds and no friction rub.  No murmur heard. No peripheral edema  Pulmonary/Chest: Effort normal and breath sounds normal. No respiratory distress.  Skin: Skin is warm, dry and intact. No rash noted.  Psychiatric: He has a normal mood and affect. His speech is normal and behavior is normal. Thought content normal.    Diabetic foot exam: Normal inspection No skin breakdown No calluses  Normal DP pulses Normal sensation to light touch and monofilament Nails normal       Assessment & Plan:

## 2018-01-18 NOTE — Patient Instructions (Signed)
Start metro gel on face for rosacea.  Restart  Glipizide as not likely causing rash. Continue ,metfomrin and [place].

## 2018-03-26 ENCOUNTER — Other Ambulatory Visit: Payer: Self-pay | Admitting: Family Medicine

## 2018-04-06 ENCOUNTER — Telehealth: Payer: Self-pay | Admitting: Family Medicine

## 2018-04-06 DIAGNOSIS — E1165 Type 2 diabetes mellitus with hyperglycemia: Secondary | ICD-10-CM

## 2018-04-06 NOTE — Telephone Encounter (Signed)
-----   Message from Lendon Collar, RT sent at 04/01/2018  9:47 AM EDT ----- Regarding: Lab orders for Friday 04/12/18 Please enter 11month follow up lab orders for 04/12/18. Thanks!

## 2018-04-12 ENCOUNTER — Other Ambulatory Visit: Payer: BC Managed Care – PPO

## 2018-04-15 ENCOUNTER — Telehealth: Payer: Self-pay | Admitting: Family Medicine

## 2018-04-15 NOTE — Telephone Encounter (Signed)
Copied from [place] [id number]. Topic: Quick Communication - See Telephone Encounter >> Apr 15, 2018  3:38 PM Nils Flack, Marland Kitchen wrote: CRM for notification. See Telephone encounter for: 04/15/18. Pt was told by his ins that metFORMIN (GLUMETZA) 500 MG (MOD) 24 hr tablet is not covered any more. He needs to have rx sent in for metformin ER 500 plain sent to [facility] [address]

## 2018-04-15 NOTE — Telephone Encounter (Signed)
Patient has been taking Glumetza 500 mg 24 hour tablet 4 tablets daily with breakfast.  Ok to change to regular Metformin.  Ok to take 4 tablets with breakfast or should he do 2 tablets twice a day.  Please advise.

## 2018-04-16 MED ORDER — METFORMIN HCL 500 MG PO TABS: 1000.0000 mg | ORAL_TABLET | Freq: Two times a day (BID) | ORAL | 1 refills | Status: DC

## 2018-04-16 NOTE — Telephone Encounter (Signed)
New Prescription for Metformin 500 mg take 2 tablets twice a day sent to [facility] [address].  Joseph Lam notified that prescription has been sent to his pharmacy.  I also advised he will now need to take this medication 2 tablets twice a day instead of on 4 tablets with breakfast.  Patient states understanding.

## 2018-04-16 NOTE — Telephone Encounter (Signed)
Needs to change to 2 tabs twice daily if plain metformin

## 2018-04-19 ENCOUNTER — Ambulatory Visit: Payer: BC Managed Care – PPO | Admitting: Family Medicine

## 2018-05-01 ENCOUNTER — Other Ambulatory Visit (INDEPENDENT_AMBULATORY_CARE_PROVIDER_SITE_OTHER): Payer: BC Managed Care – PPO

## 2018-05-01 DIAGNOSIS — E1165 Type 2 diabetes mellitus with hyperglycemia: Secondary | ICD-10-CM

## 2018-05-01 LAB — HEMOGLOBIN A1C: Hgb A1c MFr Bld: 6.8 % — ABNORMAL HIGH (ref 4.6–6.5)

## 2018-05-07 ENCOUNTER — Encounter: Payer: Self-pay | Admitting: Family Medicine

## 2018-05-07 ENCOUNTER — Ambulatory Visit: Payer: BC Managed Care – PPO | Admitting: Family Medicine

## 2018-05-07 VITALS — BP 124/80 | HR 74 | Temp 98.4°F | Ht 69.25 in | Wt 190.0 lb

## 2018-05-07 DIAGNOSIS — Z23 Encounter for immunization: Secondary | ICD-10-CM

## 2018-05-07 DIAGNOSIS — E119 Type 2 diabetes mellitus without complications: Secondary | ICD-10-CM

## 2018-05-07 NOTE — Assessment & Plan Note (Signed)
Significant improvement in control of  DM on current medication regimen. Npow at goal. Continue dietary and exercise changes.

## 2018-05-07 NOTE — Patient Instructions (Signed)
Keep up great work on diet and exercise.  Go into holidays with a eating plan!

## 2018-05-07 NOTE — Progress Notes (Signed)
   Subjective:    Patient ID: Joseph Lam, male    DOB: [birth year], 52 y.o.   MRN: [id number]  Diabetes  Pertinent negatives for hypoglycemia include no headaches. Pertinent negatives for diabetes include no chest pain and no fatigue.      52 year old male presents for 3 month follow up.  Diabetes:   Good control now on new regimen. Glipizide, metformin and januvia No SE. Working hard on diet and lifestyle changes. Going to gym, walking  Wt Readings from Last 3 Encounters:  05/07/18 190 lb (86.2 kg)  01/18/18 193 lb 8 oz (87.8 kg)  10/02/17 193 lb 8 oz (87.8 kg)   Lab Results  Component Value Date   HGBA1C 6.8 (H) 05/01/2018  Using medications without difficulties: Hypoglycemic episodes: none Hyperglycemic episodes:none Feet problems:no ulcers Blood Sugars averaging: FBS 110-120 eye exam within last year: 11/2017 retinopathy   Blood pressure 124/80, pulse 74, temperature 98.4 F (36.9 C), temperature source Oral, height 5' 9.25" (1.759 m), weight 190 lb (86.2 kg).   Review of Systems  Constitutional: Negative for fatigue and fever.  HENT: Negative for ear pain.   Eyes: Negative for pain.  Respiratory: Negative for cough and shortness of breath.   Cardiovascular: Negative for chest pain, palpitations and leg swelling.  Gastrointestinal: Negative for abdominal pain.  Genitourinary: Negative for dysuria.  Musculoskeletal: Negative for arthralgias.  Neurological: Negative for syncope, light-headedness and headaches.  Psychiatric/Behavioral: Negative for dysphoric mood.    Occ tingling in left arm in night, has to shake it to make it go away.    Objective:   Physical Exam  Constitutional: He appears well-developed and well-nourished.  Non-toxic appearance. He does not appear ill. No distress.  HENT:  Head: Normocephalic and atraumatic.  Right Ear: Hearing, tympanic membrane, external ear and ear canal normal.  Left Ear: Hearing, tympanic membrane, external ear and ear  canal normal.  Nose: Nose normal.  Mouth/Throat: Uvula is midline, oropharynx is clear and moist and mucous membranes are normal.  Eyes: Pupils are equal, round, and reactive to light. Conjunctivae, EOM and lids are normal. Lids are everted and swept, no foreign bodies found.  Neck: Trachea normal, normal range of motion and phonation normal. Neck supple. Carotid bruit is not present. No thyroid mass and no thyromegaly present.  Cardiovascular: Normal rate, regular rhythm, S1 normal, S2 normal, intact distal pulses and normal pulses. Exam reveals no gallop.  No murmur heard. Pulmonary/Chest: Breath sounds normal. He has no wheezes. He has no rhonchi. He has no rales.  Abdominal: Soft. Normal appearance and bowel sounds are normal. There is no hepatosplenomegaly. There is no tenderness. There is no rebound, no guarding and no CVA tenderness. No hernia.  Lymphadenopathy:    He has no cervical adenopathy.  Neurological: He is alert. He has normal strength and normal reflexes. No cranial nerve deficit or sensory deficit. Gait normal.  Neg tinel and phalen bilaterally  Skin: Skin is warm, dry and intact. No rash noted.  Psychiatric: He has a normal mood and affect. His speech is normal and behavior is normal. Judgment normal.     Diabetic foot exam: Normal inspection No skin breakdown No calluses  Normal DP pulses Normal sensation to light touch and monofilament Nails normal      Assessment & Plan:

## 2018-05-14 ENCOUNTER — Other Ambulatory Visit: Payer: Self-pay | Admitting: Family Medicine

## 2018-08-06 ENCOUNTER — Telehealth: Payer: Self-pay | Admitting: Family Medicine

## 2018-08-06 DIAGNOSIS — E1165 Type 2 diabetes mellitus with hyperglycemia: Secondary | ICD-10-CM

## 2018-08-06 DIAGNOSIS — Z125 Encounter for screening for malignant neoplasm of prostate: Secondary | ICD-10-CM

## 2018-08-06 NOTE — Telephone Encounter (Signed)
-----   Message from Ellamae Sia sent at 07/29/2018  9:57 AM EST ----- Regarding: Lab orders for Wednesday, 1.22.20 Lab orders for a 3 month follow up appt.

## 2018-08-07 ENCOUNTER — Ambulatory Visit: Payer: BC Managed Care – PPO

## 2018-08-07 ENCOUNTER — Other Ambulatory Visit (INDEPENDENT_AMBULATORY_CARE_PROVIDER_SITE_OTHER): Payer: BC Managed Care – PPO

## 2018-08-07 DIAGNOSIS — Z125 Encounter for screening for malignant neoplasm of prostate: Secondary | ICD-10-CM

## 2018-08-07 DIAGNOSIS — E1165 Type 2 diabetes mellitus with hyperglycemia: Secondary | ICD-10-CM

## 2018-08-07 LAB — LIPID PANEL
Cholesterol: 147 mg/dL (ref 0–200)
HDL: 41.2 mg/dL (ref >39.00)
LDL Cholesterol: 76 mg/dL (ref 0–99)
NONHDL: 106
TRIGLYCERIDES: 148 mg/dL (ref 0.0–149.0)
Total CHOL/HDL Ratio: 4
VLDL: 29.6 mg/dL (ref 0.0–40.0)

## 2018-08-07 LAB — COMPREHENSIVE METABOLIC PANEL
ALT: 24 U/L (ref 0–53)
AST: 17 U/L (ref 0–37)
Albumin: 4.8 g/dL (ref 3.5–5.2)
Alkaline Phosphatase: 24 U/L — ABNORMAL LOW (ref 39–117)
BUN: 20 mg/dL (ref 6–23)
CALCIUM: 10.3 mg/dL (ref 8.4–10.5)
CO2: 30 mEq/L (ref 19–32)
Chloride: 105 mEq/L (ref 96–112)
Creatinine, Ser: 1.09 mg/dL (ref 0.40–1.50)
GFR: 70.38 mL/min (ref >60.00)
Glucose, Bld: 75 mg/dL (ref 70–99)
POTASSIUM: 4.3 meq/L (ref 3.5–5.1)
Sodium: 141 mEq/L (ref 135–145)
Total Bilirubin: 0.5 mg/dL (ref 0.2–1.2)
Total Protein: 7.3 g/dL (ref 6.0–8.3)

## 2018-08-07 LAB — PSA: PSA: 1.65 ng/mL (ref 0.10–4.00)

## 2018-08-07 LAB — HEMOGLOBIN A1C: Hgb A1c MFr Bld: 6.4 % (ref 4.6–6.5)

## 2018-08-13 ENCOUNTER — Ambulatory Visit: Payer: BC Managed Care – PPO | Admitting: Family Medicine

## 2018-08-13 ENCOUNTER — Encounter: Payer: Self-pay | Admitting: Family Medicine

## 2018-08-13 VITALS — BP 140/86 | HR 84 | Temp 98.6°F | Ht 69.25 in | Wt 188.0 lb

## 2018-08-13 DIAGNOSIS — E1169 Type 2 diabetes mellitus with other specified complication: Secondary | ICD-10-CM | POA: Diagnosis not present

## 2018-08-13 DIAGNOSIS — E119 Type 2 diabetes mellitus without complications: Secondary | ICD-10-CM | POA: Diagnosis not present

## 2018-08-13 DIAGNOSIS — E785 Hyperlipidemia, unspecified: Secondary | ICD-10-CM

## 2018-08-13 NOTE — Assessment & Plan Note (Signed)
Great control on fenofibrate and atorvastatin.

## 2018-08-13 NOTE — Assessment & Plan Note (Signed)
Great control...after decrease in metfomrin to 1000 mg daily.Marland Kitchen asked pt to split dose as it is not long acting.  If weight loss and diet changes occuring.. consider stopping glipizide next.

## 2018-08-13 NOTE — Patient Instructions (Addendum)
Split metformin dose to take 1 in AM and 1 in PM.  Keep up the great work!

## 2018-08-13 NOTE — Progress Notes (Signed)
   Subjective:    Patient ID: Joseph Lam, male    DOB: 13-Jan-1966, 53 y.o.   MRN: [id number]  HPI  53 year old male presents for follow up DM.  Diabetes:    Great control on current regimen! On metformin ( cut back to 2 tabs a daily given SE), januvia, glipizide Lab Results  Component Value Date   HGBA1C 6.4 08/07/2018  Using medications without difficulties: Hypoglycemic episodes:none Hyperglycemic episodes:none Feet problems:no ulcers Blood Sugars averaging: FBS 78-128 eye exam within last year: 12/14/2017   Elevated Cholesterol: Improved control and LDL now at goal! On atorvastatin and fenofibrate Using medications without problems:none Muscle aches: none Diet compliance: good Exercise: occ Other complaints:  Stable PSA. Lab Results  Component Value Date   PSA 1.65 08/07/2018   PSA 1.28 09/11/2017   PSA 0.96 12/15/2015    Wt Readings from Last 3 Encounters:  08/13/18 188 lb (85.3 kg)  05/07/18 190 lb (86.2 kg)  01/18/18 193 lb 8 oz (87.8 kg)  Body mass index is 27.56 kg/m.    Social History /Family History/Past Medical History reviewed in detail and updated in EMR if needed. Blood pressure 140/86, pulse 84, temperature 98.6 F (37 C), temperature source Oral, height 5' 9.25" (1.759 m), weight 188 lb (85.3 kg), SpO2 97 %.; Review of Systems  Constitutional: Negative for fatigue and fever.  HENT: Negative for ear pain.   Eyes: Negative for pain.  Respiratory: Negative for cough and shortness of breath.   Cardiovascular: Negative for chest pain, palpitations and leg swelling.  Gastrointestinal: Negative for abdominal pain.  Genitourinary: Negative for dysuria.  Musculoskeletal: Negative for arthralgias.  Neurological: Negative for syncope, light-headedness and headaches.  Psychiatric/Behavioral: Negative for dysphoric mood.       Objective:   Physical Exam Constitutional:      Appearance: He is well-developed.  HENT:     Head: Normocephalic.   Right Ear: Hearing normal.     Left Ear: Hearing normal.     Nose: Nose normal.  Neck:     Thyroid: No thyroid mass or thyromegaly.     Vascular: No carotid bruit.     Trachea: Trachea normal.  Cardiovascular:     Rate and Rhythm: Normal rate and regular rhythm.     Pulses: Normal pulses.     Heart sounds: Heart sounds not distant. No murmur. No friction rub. No gallop.      Comments: No peripheral edema Pulmonary:     Effort: Pulmonary effort is normal. No respiratory distress.     Breath sounds: Normal breath sounds.  Skin:    General: Skin is warm and dry.     Findings: No rash.  Psychiatric:        Speech: Speech normal.        Behavior: Behavior normal.        Thought Content: Thought content normal.     Diabetic foot exam: Normal inspection No skin breakdown No calluses  Normal DP pulses Normal sensation to light touch and monofilament Nails normal       Assessment & Plan:

## 2018-08-15 ENCOUNTER — Other Ambulatory Visit: Payer: Self-pay | Admitting: Family Medicine

## 2018-10-16 ENCOUNTER — Other Ambulatory Visit: Payer: Self-pay | Admitting: Family Medicine

## 2018-11-16 ENCOUNTER — Other Ambulatory Visit: Payer: Self-pay | Admitting: Family Medicine

## 2018-12-15 ENCOUNTER — Other Ambulatory Visit: Payer: Self-pay | Admitting: Family Medicine

## 2019-02-11 ENCOUNTER — Telehealth: Payer: Self-pay | Admitting: Family Medicine

## 2019-02-11 ENCOUNTER — Other Ambulatory Visit (INDEPENDENT_AMBULATORY_CARE_PROVIDER_SITE_OTHER): Payer: BC Managed Care – PPO

## 2019-02-11 DIAGNOSIS — E119 Type 2 diabetes mellitus without complications: Secondary | ICD-10-CM | POA: Diagnosis not present

## 2019-02-11 DIAGNOSIS — E785 Hyperlipidemia, unspecified: Secondary | ICD-10-CM

## 2019-02-11 DIAGNOSIS — E1169 Type 2 diabetes mellitus with other specified complication: Secondary | ICD-10-CM

## 2019-02-11 LAB — HEMOGLOBIN A1C: Hgb A1c MFr Bld: 6.1 % (ref 4.6–6.5)

## 2019-02-11 LAB — COMPREHENSIVE METABOLIC PANEL
ALT: 20 U/L (ref 0–53)
AST: 15 U/L (ref 0–37)
Albumin: 4.6 g/dL (ref 3.5–5.2)
Alkaline Phosphatase: 24 U/L — ABNORMAL LOW (ref 39–117)
BUN: 13 mg/dL (ref 6–23)
CO2: 29 mEq/L (ref 19–32)
Calcium: 9.5 mg/dL (ref 8.4–10.5)
Chloride: 107 mEq/L (ref 96–112)
Creatinine, Ser: 0.91 mg/dL (ref 0.40–1.50)
GFR: 86.51 mL/min (ref >60.00)
Glucose, Bld: 126 mg/dL — ABNORMAL HIGH (ref 70–99)
Potassium: 4.2 mEq/L (ref 3.5–5.1)
Sodium: 142 mEq/L (ref 135–145)
Total Bilirubin: 0.5 mg/dL (ref 0.2–1.2)
Total Protein: 6.7 g/dL (ref 6.0–8.3)

## 2019-02-11 LAB — LIPID PANEL
Cholesterol: 208 mg/dL — ABNORMAL HIGH (ref 0–200)
HDL: 47.9 mg/dL (ref >39.00)
LDL Cholesterol: 133 mg/dL — ABNORMAL HIGH (ref 0–99)
NonHDL: 160.09
Total CHOL/HDL Ratio: 4
Triglycerides: 133 mg/dL (ref 0.0–149.0)
VLDL: 26.6 mg/dL (ref 0.0–40.0)

## 2019-02-11 LAB — MICROALBUMIN / CREATININE URINE RATIO
Creatinine,U: 77.5 mg/dL
Microalb Creat Ratio: 0.9 mg/g (ref 0.0–30.0)
Microalb, Ur: <0.7 mg/dL (ref 0.0–1.9)

## 2019-02-11 NOTE — Telephone Encounter (Signed)
-----   Message from Ellamae Sia sent at 02/04/2019 10:49 AM EDT ----- Regarding: Lab orders for Tuesday, 7.28.20 Patient is scheduled for CPX labs, please order future labs, Thanks , Karna Christmas

## 2019-02-13 ENCOUNTER — Other Ambulatory Visit: Payer: Self-pay | Admitting: Family Medicine

## 2019-02-18 ENCOUNTER — Telehealth: Payer: Self-pay

## 2019-02-18 ENCOUNTER — Ambulatory Visit (INDEPENDENT_AMBULATORY_CARE_PROVIDER_SITE_OTHER): Payer: BC Managed Care – PPO | Admitting: Family Medicine

## 2019-02-18 ENCOUNTER — Encounter: Payer: Self-pay | Admitting: Family Medicine

## 2019-02-18 ENCOUNTER — Other Ambulatory Visit: Payer: Self-pay

## 2019-02-18 VITALS — BP 120/80 | HR 82 | Temp 98.3°F | Ht 69.0 in | Wt 170.5 lb

## 2019-02-18 DIAGNOSIS — E119 Type 2 diabetes mellitus without complications: Secondary | ICD-10-CM

## 2019-02-18 DIAGNOSIS — E785 Hyperlipidemia, unspecified: Secondary | ICD-10-CM

## 2019-02-18 DIAGNOSIS — F5104 Psychophysiologic insomnia: Secondary | ICD-10-CM | POA: Insufficient documentation

## 2019-02-18 DIAGNOSIS — Z0000 Encounter for general adult medical examination without abnormal findings: Secondary | ICD-10-CM

## 2019-02-18 DIAGNOSIS — J4520 Mild intermittent asthma, uncomplicated: Secondary | ICD-10-CM | POA: Diagnosis not present

## 2019-02-18 DIAGNOSIS — E1169 Type 2 diabetes mellitus with other specified complication: Secondary | ICD-10-CM

## 2019-02-18 MED ORDER — TRAZODONE HCL 50 MG PO TABS: 25.0000 mg | ORAL_TABLET | Freq: Every evening | ORAL | 3 refills | Status: DC | PRN

## 2019-02-18 NOTE — Assessment & Plan Note (Signed)
Great control on current regimen. Encouraged exercise, weight loss, healthy eating habits.

## 2019-02-18 NOTE — Telephone Encounter (Signed)
[facility] Night - Client Nonclinical Telephone Record [facility] Client [place] Night - Client Client Site [place] Physician Eliezer Lofts - MD Contact Type Call Who Is Calling Patient / Member / Family / Caregiver Caller Name [place] Phone Number [phone] Call Type Message Only Information Provided Reason for Call Returning a Call from the Office Initial Joseph Lam states he has an appointment in the morning, and got a call to call back. Additional Comment Office hours provided. Caller said his appointment is at 9 in the am and not sure if it was in person or not. Call Closed By: Cristy Folks Transaction Date/Time: 02/17/2019 5:21:35 PM (ET)

## 2019-02-18 NOTE — Assessment & Plan Note (Signed)
No flares.

## 2019-02-18 NOTE — Patient Instructions (Addendum)
Change glipizide to daytime dosing.  Set up yearly eye exam.  Call to set up colonscopy  With Dr. Hilarie Fredrickson.  Make sure taking daily atorvastatin.

## 2019-02-18 NOTE — Assessment & Plan Note (Signed)
Sleep hygiene reviewed. Restart exercise.  Trial of trazodone.

## 2019-02-18 NOTE — Telephone Encounter (Signed)
I spoke with Butch Penny CMA and pt is already in office. Nothing further needed.

## 2019-02-18 NOTE — Assessment & Plan Note (Signed)
No longer at goal.  Make sure to be compliant with med, get back to exercise.

## 2019-02-18 NOTE — Progress Notes (Signed)
Chief Complaint  Patient presents with  . Annual Exam    History of Present Illness: HPI   The patient is here for annual wellness exam and preventative care.    Diabetes:  Great control on glipizide, januvia, metformin ( now only taking 2 pills a day) Lab Results  Component Value Date   HGBA1C 6.1 02/11/2019  Using medications without difficulties: Hypoglycemic episodes: 2 times if does not have bedtime  Snack.. 68-70 Hyperglycemic episodes:none Feet problems:no ulcers Blood Sugars averaging: not checking lately eye exam within last year: DUE microalbumin neg 02/11/2019  Elevated Cholesterol: LDL no longer at goal on atorvastatin 40 mg daily ( has not been taking regularly). Trigs at goal on fenofibrate. Lab Results  Component Value Date   CHOL 208 (H) 02/11/2019   HDL 47.90 02/11/2019   LDLCALC 133 (H) 02/11/2019   LDLDIRECT 96.0 12/27/2017   TRIG 133.0 02/11/2019   CHOLHDL 4 02/11/2019  Using medications without problems:none Muscle aches: none Diet compliance:good Exercise: less than previous, no longer walking as much. Other complaints:  Mild intermittent asthma: no flares.    Chronic insomnia:  Ongoing in last 2-3  month.Benadryl does not help any longer and gives SE. He sleeps 1 hour than wakes up every hours. Has been under stress. NO GAD, no depression.    COVID 19 screen No recent travel or known exposure to COVID19 The patient denies respiratory symptoms of COVID 19 at this time.  The importance of social distancing was discussed today.   Review of Systems  Constitutional: Negative for chills and fever.  HENT: Negative for congestion and ear pain.   Eyes: Negative for pain and redness.  Respiratory: Negative for cough and shortness of breath.   Cardiovascular: Negative for chest pain, palpitations and leg swelling.  Gastrointestinal: Negative for abdominal pain, blood in stool, constipation, diarrhea, nausea and vomiting.  Genitourinary: Negative for  dysuria.  Musculoskeletal: Negative for falls and myalgias.  Skin: Negative for rash.  Neurological: Negative for dizziness.  Psychiatric/Behavioral: Negative for depression. The patient is not nervous/anxious.       Past Medical History:  Diagnosis Date  . Diabetes mellitus ([place])   . Hyperlipidemia   . Substance abuse ([place])     reports that he has quit smoking. He has never used smokeless tobacco. He reports current alcohol use of about 1.0 standard drinks of alcohol per week. He reports that he does not use drugs.   Current Outpatient Medications:  .  aspirin 81 MG tablet, Take 81 mg by mouth daily., Disp: , Rfl:  .  atorvastatin (LIPITOR) 40 MG tablet, TAKE 1 TABLET BY MOUTH EVERY DAY, Disp: 90 tablet, Rfl: 1 .  Blood Glucose Monitoring Suppl (ONE TOUCH ULTRA 2) w/Device KIT, CHECK BLOOD SUGAR DAILY, Disp: , Rfl: 0 .  fenofibrate 160 MG tablet, TAKE 1 TABLET BY MOUTH EVERY DAY, Disp: 90 tablet, Rfl: 3 .  glipiZIDE (GLUCOTROL XL) 10 MG 24 hr tablet, TAKE 1 TABLET (10 MG TOTAL) BY MOUTH DAILY WITH BREAKFAST., Disp: 90 tablet, Rfl: 0 .  JANUVIA 100 MG tablet, TAKE 1 TABLET BY MOUTH EVERY DAY, Disp: 90 tablet, Rfl: 3 .  metFORMIN (GLUCOPHAGE) 500 MG tablet, Take 2 tablets (1,000 mg total) by mouth 2 (two) times daily with a meal. (Patient taking differently: Take 1,000 mg by mouth daily. ), Disp: 360 tablet, Rfl: 1 .  metroNIDAZOLE (METROGEL) 1 % gel, Apply topically daily., Disp: 45 g, Rfl: 0 .  MICROLET LANCETS MISC, Check blood  sugar 1-2 times daily and as directed. 250.00, Disp: 100 each, Rfl: 11 .  Omega-3 Fatty Acids (FISH OIL) 1000 MG CAPS, Take 1 capsule by mouth daily., Disp: , Rfl:  .  Omeprazole Magnesium (PRILOSEC OTC PO), Take by mouth., Disp: , Rfl:  .  ONETOUCH ULTRA test strip, CHECK BLOOD SUGAR DAILY, Disp: 100 each, Rfl: 3 .  triamcinolone cream (KENALOG) 0.5 %, Apply 1 application topically 2 (two) times daily., Disp: 30 g, Rfl: 0   Observations/Objective: Blood  pressure 120/80, pulse 82, temperature 98.3 F (36.8 C), temperature source Temporal, height 5' 9"  (1.753 m), weight 170 lb 8 oz (77.3 kg), SpO2 97 %.  Physical Exam Constitutional:      General: He is not in acute distress.    Appearance: Normal appearance. He is well-developed. He is not ill-appearing or toxic-appearing.  HENT:     Head: Normocephalic and atraumatic.     Right Ear: Hearing, tympanic membrane, ear canal and external ear normal.     Left Ear: Hearing, tympanic membrane, ear canal and external ear normal.     Nose: Nose normal.     Mouth/Throat:     Pharynx: Uvula midline.  Eyes:     General: Lids are normal. Lids are everted, no foreign bodies appreciated.     Conjunctiva/sclera: Conjunctivae normal.     Pupils: Pupils are equal, round, and reactive to light.  Neck:     Musculoskeletal: Normal range of motion and neck supple.     Thyroid: No thyroid mass or thyromegaly.     Vascular: No carotid bruit.     Trachea: Trachea and phonation normal.  Cardiovascular:     Rate and Rhythm: Normal rate and regular rhythm.     Pulses: Normal pulses.     Heart sounds: S1 normal and S2 normal. No murmur. No gallop.   Pulmonary:     Breath sounds: Normal breath sounds. No wheezing, rhonchi or rales.  Abdominal:     General: Bowel sounds are normal.     Palpations: Abdomen is soft.     Tenderness: There is no abdominal tenderness. There is no guarding or rebound.     Hernia: No hernia is present.  Lymphadenopathy:     Cervical: No cervical adenopathy.  Skin:    General: Skin is warm and dry.     Findings: No rash.  Neurological:     Mental Status: He is alert.     Cranial Nerves: No cranial nerve deficit.     Sensory: No sensory deficit.     Gait: Gait normal.     Deep Tendon Reflexes: Reflexes are normal and symmetric.  Psychiatric:        Speech: Speech normal.        Behavior: Behavior normal.        Judgment: Judgment normal.      Assessment and Plan The  patient's preventative maintenance and recommended screening tests for an annual wellness exam were reviewed in full today. Brought up to date unless services declined.  Counselled on the importance of diet, exercise, and its role in overall health and mortality. The patient's FH and SH was reviewed, including their home life, tobacco status, and drug and alcohol status.   Prostate cancer In Dad At age 69.  Lab Results  Component Value Date   PSA 1.65 08/07/2018   PSA 1.28 09/11/2017   PSA 0.96 12/15/2015  Vaccines: Uptodate Colon cancer screening: colonoscopy 2016 tubular adenoma, Dr. Hilarie Fredrickson, repeat in  3 years. Nonsmoker  HIV: refused. Hep C: done    Chronic insomnia  Sleep hygiene reviewed. Restart exercise.  Trial of trazodone.  Type II diabetes mellitus, well controlled ([place])  Great control on current regimen. Encouraged exercise, weight loss, healthy eating habits.   Mild intermittent asthma No flares.  Hyperlipidemia associated with type 2 diabetes mellitus ([place]) No longer at goal.  Make sure to be compliant with med, get back to exercise.     Eliezer Lofts, MD

## 2019-02-19 ENCOUNTER — Telehealth: Payer: Self-pay | Admitting: *Deleted

## 2019-02-19 MED ORDER — ATORVASTATIN CALCIUM 40 MG PO TABS: 40.0000 mg | ORAL_TABLET | Freq: Every day | ORAL | 3 refills | Status: DC

## 2019-02-19 NOTE — Telephone Encounter (Signed)
Received fax from [facility] that states they had spoke to patient about diabetes and noticed that Joseph Lam previously received statin therapy but has not filled at a [facility] pharmacy in the last 180 days .  They are asking if it is appropriate to restart the statin therapy.  Okay to restart per Dr. Diona Browner.  Spoke with Joseph Lam.  He would like to restart his atorvastatin.  [facility] [address].

## 2019-04-13 ENCOUNTER — Other Ambulatory Visit: Payer: Self-pay | Admitting: Family Medicine

## 2019-05-08 ENCOUNTER — Other Ambulatory Visit: Payer: Self-pay | Admitting: Family Medicine

## 2019-07-03 ENCOUNTER — Other Ambulatory Visit: Payer: Self-pay | Admitting: Family Medicine

## 2019-07-31 ENCOUNTER — Other Ambulatory Visit: Payer: Self-pay

## 2019-07-31 ENCOUNTER — Ambulatory Visit: Payer: BC Managed Care – PPO | Admitting: Family Medicine

## 2019-07-31 ENCOUNTER — Encounter: Payer: Self-pay | Admitting: Family Medicine

## 2019-07-31 VITALS — BP 130/90 | HR 84 | Temp 98.8°F | Ht 69.0 in | Wt 165.8 lb

## 2019-07-31 DIAGNOSIS — S43101A Unspecified dislocation of right acromioclavicular joint, initial encounter: Secondary | ICD-10-CM | POA: Diagnosis not present

## 2019-07-31 DIAGNOSIS — S4991XA Unspecified injury of right shoulder and upper arm, initial encounter: Secondary | ICD-10-CM | POA: Diagnosis not present

## 2019-07-31 NOTE — Progress Notes (Signed)
Joseph T. Copland, MD Primary Care and [place] at [facility] [place], [address] Phone: [phone]  FAX: [phone]  Joseph Lam - 54 y.o. male  MRN [id number]  Date of Birth: [birth year]  Visit Date: 07/31/2019  PCP: Jinny Sanders, MD  Referred by: Jinny Sanders, MD  Chief Complaint  Patient presents with  . Arm Injury    Right  . Fall    Tuesday    This visit occurred during the SARS-CoV-2 public health emergency.  Safety protocols were in place, including screening questions prior to the visit, additional usage of staff PPE, and extensive cleaning of exam room while observing appropriate contact time as indicated for disinfecting solutions.   Subjective:   Joseph Lam is a 53 y.o. very pleasant male patient with Body mass index is 24.48 kg/m. who presents with the following:  DOI:  07/29/2019  On Tuesday the patient fell with his arm in abduction and his arm was pulled posteriorly.  At that time he had some very significant pain.  He did feel as if his shoulder moved in the socket.  But it did not dislocate.  Since this time he has had pain in and around the upper chest, the deltoid as well as somewhat posterior of the shoulder.  Initially had difficulty abducting it and what as well as flexing it, but this has improved compared to the initial injury.  He has no significant prior history of shoulder problems.  No prior dislocations.  Or fractures.  He denies having any radicular symptoms, but he does have some neck discomfort on the right side as well.  [place] Tuesday;  Trouble with R shoulder.  Difficulty with abd and shoulder on the Right.   Past Medical History, Surgical History, Social History, Family History, Problem List, Medications, and Allergies have been reviewed and updated if relevant.   GEN: No fevers, chills. Nontoxic. Primarily MSK c/o today. MSK: Detailed in the HPI GI: tolerating PO  intake without difficulty Neuro: No numbness, parasthesias, or tingling associated. Otherwise the pertinent positives of the ROS are noted above.   Objective:   BP 130/90   Pulse 84   Temp 98.8 F (37.1 C) (Temporal)   Ht 5' 9"  (1.753 m)   Wt 165 lb 12 oz (75.2 kg)   SpO2 98%   BMI 24.48 kg/m    GEN: WDWN, NAD, Non-toxic, Alert & Oriented x 3 HEENT: Atraumatic, Normocephalic.  Ears and Nose: No external deformity. EXTR: No clubbing/cyanosis/edema NEURO: Normal gait.  PSYCH: Normally interactive. Conversant. Not depressed or anxious appearing.  Calm demeanor.    Right shoulder there is no pain to palpation of the collar or the skin dermatome or the scapula.  There is tenderness to palpation at the [facility] joint.  He is able to elevate his shoulder in the plane of abduction as well as 30 degrees without lack of strength.  Does cause him some pain with the arc of abduction.  Flexion also causes some discomfort.  Internal and external rotation are full.  Strength is 5/5 throughout in all testing.  Jobe test is negative.  Neer test and Michel Bickers test cause some mild pain. Sulcus test is negative.  Some discomfort with apprehension test.  Flexion and extension as well as all forearm strength and grip is fully intact and normal.  Neurovascularly intact.  The entirety of the humerus is nontender.  A/P:  ICD-10-CM   1. Right shoulder injury, initial encounter  S49.91XA   2. Acromioclavicular separation, type 1, right, initial encounter  S43.101A    Total encounter time: 30-39 minutes. On the day of the patient encounter, this can include review of prior records, labs, and imaging.  Additional time can include counselling, consultation with peer MD in person or by telephone.  This also includes independent review of Radiology.  History would be very good for subluxation event which would describe all of his pain well.  He should be better in about 2 weeks from this.  He  also has some muscular strain in the deltoid as well as the upper pack.  Supraspinatus appears intact.  He may have some minor injury to the infraspinatus and subscapularis, but I think that this will resolve without difficulty.  Follow-up with me in 3 to 4 weeks if he still having problems.  Follow-up: No follow-ups on file.  No orders of the defined types were placed in this encounter.  No orders of the defined types were placed in this encounter.   Signed,  Maud Deed. Copland, MD   Outpatient Encounter Medications as of 07/31/2019  Medication Sig  . aspirin 81 MG tablet Take 81 mg by mouth daily.  Marland Kitchen atorvastatin (LIPITOR) 40 MG tablet Take 1 tablet (40 mg total) by mouth daily.  . Blood Glucose Monitoring Suppl (ONE TOUCH ULTRA 2) w/Device KIT CHECK BLOOD SUGAR DAILY  . fenofibrate 160 MG tablet TAKE 1 TABLET BY MOUTH EVERY DAY  . JANUVIA 100 MG tablet TAKE 1 TABLET BY MOUTH EVERY DAY  . metFORMIN (GLUCOPHAGE) 500 MG tablet Take 1 tablet (500 mg total) by mouth 2 (two) times daily with a meal.  . metroNIDAZOLE (METROGEL) 1 % gel Apply topically daily.  Marland Kitchen MICROLET LANCETS MISC Check blood sugar 1-2 times daily and as directed. 250.00  . Omega-3 Fatty Acids (FISH OIL) 1000 MG CAPS Take 1 capsule by mouth daily.  . Omeprazole Magnesium (PRILOSEC OTC PO) Take 1 tablet by mouth daily as needed.   Glory Rosebush ULTRA test strip CHECK BLOOD SUGAR DAILY  . traZODone (DESYREL) 50 MG tablet TAKE 1/2 TO 1 TABLETS (25-50 MG TOTAL) BY MOUTH AT BEDTIME AS NEEDED FOR SLEEP.  Marland Kitchen triamcinolone cream (KENALOG) 0.5 % Apply 1 application topically 2 (two) times daily.  . [DISCONTINUED] GLIPIZIDE XL 10 MG 24 hr tablet TAKE 1 TABLET (10 MG TOTAL) BY MOUTH DAILY WITH BREAKFAST.   No facility-administered encounter medications on file as of 07/31/2019.

## 2019-08-01 ENCOUNTER — Encounter: Payer: Self-pay | Admitting: Family Medicine

## 2019-08-18 ENCOUNTER — Other Ambulatory Visit (INDEPENDENT_AMBULATORY_CARE_PROVIDER_SITE_OTHER): Payer: BC Managed Care – PPO

## 2019-08-18 ENCOUNTER — Telehealth: Payer: Self-pay | Admitting: Family Medicine

## 2019-08-18 ENCOUNTER — Other Ambulatory Visit: Payer: Self-pay

## 2019-08-18 DIAGNOSIS — E785 Hyperlipidemia, unspecified: Secondary | ICD-10-CM

## 2019-08-18 DIAGNOSIS — E1169 Type 2 diabetes mellitus with other specified complication: Secondary | ICD-10-CM | POA: Diagnosis not present

## 2019-08-18 DIAGNOSIS — E119 Type 2 diabetes mellitus without complications: Secondary | ICD-10-CM | POA: Diagnosis not present

## 2019-08-18 LAB — LIPID PANEL
Cholesterol: 130 mg/dL (ref 0–200)
HDL: 45.4 mg/dL (ref >39.00)
LDL Cholesterol: 60 mg/dL (ref 0–99)
NonHDL: 84.3
Total CHOL/HDL Ratio: 3
Triglycerides: 122 mg/dL (ref 0.0–149.0)
VLDL: 24.4 mg/dL (ref 0.0–40.0)

## 2019-08-18 LAB — HEMOGLOBIN A1C: Hgb A1c MFr Bld: 6.7 % — ABNORMAL HIGH (ref 4.6–6.5)

## 2019-08-18 LAB — COMPREHENSIVE METABOLIC PANEL
ALT: 13 U/L (ref 0–53)
AST: 12 U/L (ref 0–37)
Albumin: 4.3 g/dL (ref 3.5–5.2)
Alkaline Phosphatase: 25 U/L — ABNORMAL LOW (ref 39–117)
BUN: 17 mg/dL (ref 6–23)
CO2: 28 mEq/L (ref 19–32)
Calcium: 9.4 mg/dL (ref 8.4–10.5)
Chloride: 107 mEq/L (ref 96–112)
Creatinine, Ser: 0.94 mg/dL (ref 0.40–1.50)
GFR: 83.17 mL/min (ref >60.00)
Glucose, Bld: 146 mg/dL — ABNORMAL HIGH (ref 70–99)
Potassium: 4.3 mEq/L (ref 3.5–5.1)
Sodium: 140 mEq/L (ref 135–145)
Total Bilirubin: 0.3 mg/dL (ref 0.2–1.2)
Total Protein: 6.4 g/dL (ref 6.0–8.3)

## 2019-08-18 NOTE — Telephone Encounter (Signed)
-----   Message from Cloyd Stagers, RT sent at 08/08/2019  1:13 PM EST ----- Regarding: Lab Orders for Monday 2.1.2021 Please place lab orders for Monday 2.1.2021, office visit for 6 month f/u on Friday 2.5.2021 Thank you, Dyke Maes RT(R)

## 2019-08-19 NOTE — Progress Notes (Signed)
No critical labs need to be addressed urgently. We will discuss labs in detail at upcoming office visit.   

## 2019-08-22 ENCOUNTER — Ambulatory Visit (INDEPENDENT_AMBULATORY_CARE_PROVIDER_SITE_OTHER)
Admission: RE | Admit: 2019-08-22 | Discharge: 2019-08-22 | Disposition: A | Discharge status: Home / Self Care | Payer: BC Managed Care – PPO | Source: Ambulatory Visit | Attending: Family Medicine | Admitting: Family Medicine

## 2019-08-22 ENCOUNTER — Ambulatory Visit: Payer: BC Managed Care – PPO | Admitting: Family Medicine

## 2019-08-22 ENCOUNTER — Other Ambulatory Visit: Payer: Self-pay

## 2019-08-22 VITALS — BP 134/84 | HR 78 | Temp 98.2°F | Ht 69.0 in | Wt 165.0 lb

## 2019-08-22 DIAGNOSIS — E119 Type 2 diabetes mellitus without complications: Secondary | ICD-10-CM | POA: Diagnosis not present

## 2019-08-22 DIAGNOSIS — E1169 Type 2 diabetes mellitus with other specified complication: Secondary | ICD-10-CM

## 2019-08-22 DIAGNOSIS — Z23 Encounter for immunization: Secondary | ICD-10-CM

## 2019-08-22 DIAGNOSIS — M25511 Pain in right shoulder: Secondary | ICD-10-CM | POA: Diagnosis not present

## 2019-08-22 DIAGNOSIS — E785 Hyperlipidemia, unspecified: Secondary | ICD-10-CM

## 2019-08-22 LAB — HM DIABETES FOOT EXAM

## 2019-08-22 MED ORDER — DICLOFENAC SODIUM 75 MG PO TBEC: 75.0000 mg | DELAYED_RELEASE_TABLET | Freq: Two times a day (BID) | ORAL | 0 refills | Status: DC

## 2019-08-22 NOTE — Progress Notes (Signed)
Chief Complaint  Patient presents with  . Follow-up    6 month  . Flu Vaccine    will get today    History of Present Illness: HPI   54 year old male presents for follow up DM   Right shoulder pain, x 3-4 weeks ago fell  And caught on right hand.  Some increasing in mobility but still pain with right shoulder int and ext rotation and posterior abduction.  Trouble sleeping at night with pain.  Saw Dr. Lorelei Pont on 07/29/19.  Dx with AC joint seperation, muscle strain in deltoid.  Using ibuprofen 800 mg, tylenol... 3 times daily. Helps temporarily  with pain.  Doing home PT  Diabetes:  Slightly worsened control but remains at goal on metformin and Januvia.   Lab Results  Component Value Date   HGBA1C 6.7 (H) 08/18/2019  Using medications without difficulties: none Hypoglycemic episodes: none Hyperglycemic episodes:none Feet problems: no ulcers Blood Sugars averaging: not checking eye exam within last year:   Wt Readings from Last 3 Encounters:  08/22/19 165 lb (74.8 kg)  07/31/19 165 lb 12 oz (75.2 kg)  02/18/19 170 lb 8 oz (77.3 kg)     Elevated Cholesterol: Now improved  LDL at goal < 100 on atorvastatin and fenofibrate Using medications without problems:none Muscle aches: none Diet compliance: healthy diet Exercise: minimal with cold weather, prior to cold weather Other complaints:  BP at goal < 140/90  This visit occurred during the SARS-CoV-2 public health emergency.  Safety protocols were in place, including screening questions prior to the visit, additional usage of staff PPE, and extensive cleaning of exam room while observing appropriate contact time as indicated for disinfecting solutions.   COVID 19 screen:  No recent travel or known exposure to COVID19 The patient denies respiratory symptoms of COVID 19 at this time. The importance of social distancing was discussed today.     ROS    Past Medical History:  Diagnosis Date  . Diabetes mellitus ([place])    . Hyperlipidemia   . Substance abuse ([place])     reports that he has quit smoking. He has never used smokeless tobacco. He reports current alcohol use of about 1.0 standard drinks of alcohol per week. He reports that he does not use drugs.   Current Outpatient Medications:  .  aspirin 81 MG tablet, Take 81 mg by mouth daily., Disp: , Rfl:  .  atorvastatin (LIPITOR) 40 MG tablet, Take 1 tablet (40 mg total) by mouth daily., Disp: 90 tablet, Rfl: 3 .  Blood Glucose Monitoring Suppl (ONE TOUCH ULTRA 2) w/Device KIT, CHECK BLOOD SUGAR DAILY, Disp: , Rfl: 0 .  fenofibrate 160 MG tablet, TAKE 1 TABLET BY MOUTH EVERY DAY, Disp: 90 tablet, Rfl: 3 .  JANUVIA 100 MG tablet, TAKE 1 TABLET BY MOUTH EVERY DAY, Disp: 90 tablet, Rfl: 3 .  metFORMIN (GLUCOPHAGE) 500 MG tablet, Take 1 tablet (500 mg total) by mouth 2 (two) times daily with a meal., Disp: 180 tablet, Rfl: 1 .  metroNIDAZOLE (METROGEL) 1 % gel, Apply topically daily., Disp: 45 g, Rfl: 0 .  MICROLET LANCETS MISC, Check blood sugar 1-2 times daily and as directed. 250.00, Disp: 100 each, Rfl: 11 .  Omega-3 Fatty Acids (FISH OIL) 1000 MG CAPS, Take 1 capsule by mouth daily., Disp: , Rfl:  .  Omeprazole Magnesium (PRILOSEC OTC PO), Take 1 tablet by mouth daily as needed. , Disp: , Rfl:  .  ONETOUCH ULTRA test strip, CHECK  BLOOD SUGAR DAILY, Disp: 100 each, Rfl: 3 .  traZODone (DESYREL) 50 MG tablet, TAKE 1/2 TO 1 TABLETS (25-50 MG TOTAL) BY MOUTH AT BEDTIME AS NEEDED FOR SLEEP., Disp: 90 tablet, Rfl: 0 .  triamcinolone cream (KENALOG) 0.5 %, Apply 1 application topically 2 (two) times daily., Disp: 30 g, Rfl: 0   Observations/Objective: Blood pressure 134/84, pulse 78, temperature 98.2 F (36.8 C), temperature source Temporal, height 5' 9"  (1.753 m), weight 165 lb (74.8 kg), SpO2 98 %.  Physical Exam Constitutional:      Appearance: He is well-developed.  HENT:     Head: Normocephalic.     Right Ear: Hearing normal.     Left Ear: Hearing  normal.     Nose: Nose normal.  Neck:     Thyroid: No thyroid mass or thyromegaly.     Vascular: No carotid bruit.     Trachea: Trachea normal.  Cardiovascular:     Rate and Rhythm: Normal rate and regular rhythm.     Pulses: Normal pulses.     Heart sounds: Heart sounds not distant. No murmur. No friction rub. No gallop.      Comments: No peripheral edema Pulmonary:     Effort: Pulmonary effort is normal. No respiratory distress.     Breath sounds: Normal breath sounds.  Musculoskeletal:     Right shoulder: Tenderness present. No deformity, bony tenderness or crepitus. Decreased range of motion. Normal strength. Normal pulse.     Cervical back: Normal. No tenderness or bony tenderness. Normal range of motion.     Comments:  Positive  Neers and empty can, pain with int and ext rotation and pain with abduction above 90 degrees  Minimal ttp in trapezius and in delatoid to palpation, no bony tenderness  Neg spurling bialterally.  Skin:    General: Skin is warm and dry.     Findings: No rash.  Psychiatric:        Speech: Speech normal.        Behavior: Behavior normal.        Thought Content: Thought content normal.      Diabetic foot exam: Normal inspection No skin breakdown No calluses  Normal DP pulses Normal sensation to light touch and monofilament Nails normal  Assessment and Plan   Type II diabetes mellitus, well controlled ([place]) Well controlled. Continue current medication. Encouraged exercise, weight loss, healthy eating habits.   Hyperlipidemia associated with type 2 diabetes mellitus ([place]) Well controlled on statin, more compliant with taking med now. Continue current medication.   Acute pain of right shoulder Minimal improvement in pain but some improvement in mobility.  Still with painful motion of right Will eval with X-ray and consult Dr. Jackelyn Hoehn given he saw pt initially.  Stop ibuprofen and try trial of diclofenac.  Continue home PT  shoulder.      Eliezer Lofts, MD

## 2019-08-22 NOTE — Patient Instructions (Signed)
Call Dr. Hilarie Fredrickson to set up colonoscopy: [facility] [phone]  We will call with X-ray results.  Stop ibuprofen and try diclofenac twice daily instead.  Continue home physical therapy.

## 2019-08-22 NOTE — Assessment & Plan Note (Signed)
Well controlled on statin, more compliant with taking med now. Continue current medication.

## 2019-08-22 NOTE — Assessment & Plan Note (Signed)
Well controlled. Continue current medication. Encouraged exercise, weight loss, healthy eating habits.  

## 2019-08-22 NOTE — Assessment & Plan Note (Signed)
Minimal improvement in pain but some improvement in mobility.  Still with painful motion of right Will eval with X-ray and consult Dr. Jackelyn Hoehn given he saw pt initially.  Stop ibuprofen and try trial of diclofenac.  Continue home PT shoulder.

## 2019-08-26 ENCOUNTER — Telehealth: Payer: Self-pay | Admitting: Family Medicine

## 2019-08-26 NOTE — Telephone Encounter (Signed)
Patient called today in regards to his xray results. He stated he had the xrays done on Friday and wanted to know if the results

## 2019-08-26 NOTE — Telephone Encounter (Signed)
Mr. Joseph Lam notified that his shoulder x-ray is negative.  Dr. Diona Browner recommends that he follow up with Dr. Lorelei Pont if shoulder is not improving.  Jie states his shoulder is not any better and is keeping him up at night.  Appointment scheduled with Dr. Lorelei Pont 08/27/2019 at 2:00 pm.

## 2019-08-27 ENCOUNTER — Ambulatory Visit: Payer: BC Managed Care – PPO | Admitting: Family Medicine

## 2019-08-27 ENCOUNTER — Other Ambulatory Visit: Payer: Self-pay

## 2019-08-27 ENCOUNTER — Encounter: Payer: Self-pay | Admitting: Family Medicine

## 2019-08-27 VITALS — BP 122/86 | HR 83 | Temp 98.4°F | Ht 69.0 in | Wt 170.0 lb

## 2019-08-27 DIAGNOSIS — M75101 Unspecified rotator cuff tear or rupture of right shoulder, not specified as traumatic: Secondary | ICD-10-CM

## 2019-08-27 DIAGNOSIS — M25511 Pain in right shoulder: Secondary | ICD-10-CM

## 2019-08-27 DIAGNOSIS — S43001D Unspecified subluxation of right shoulder joint, subsequent encounter: Secondary | ICD-10-CM | POA: Diagnosis not present

## 2019-08-27 DIAGNOSIS — E119 Type 2 diabetes mellitus without complications: Secondary | ICD-10-CM

## 2019-08-27 MED ORDER — DICLOFENAC SODIUM 75 MG PO TBEC: 75.0000 mg | DELAYED_RELEASE_TABLET | Freq: Two times a day (BID) | ORAL | 2 refills | Status: DC

## 2019-08-27 MED ORDER — TRAMADOL HCL 50 MG PO TABS: 50.0000 mg | ORAL_TABLET | Freq: Three times a day (TID) | ORAL | 0 refills | Status: AC | PRN

## 2019-08-27 NOTE — Progress Notes (Signed)
Arno Cullers T. Emmalise Huard, MD Primary Care and W[facility] at [facility] [place], [address] Phone: [phone]  FAX: [phone]  Joseph Lam - 54 y.o. male  MRN [id number]  Date of Birth: Jun 04, 1966  Visit Date: 08/27/2019  PCP: Jinny Sanders, MD  Referred by: Jinny Sanders, MD  Chief Complaint  Patient presents with  . Shoulder Pain    Right    This visit occurred during the SARS-CoV-2 public health emergency.  Safety protocols were in place, including screening questions prior to the visit, additional usage of staff PPE, and extensive cleaning of exam room while observing appropriate contact time as indicated for disinfecting solutions.   Subjective:   Joseph Lam is a 54 y.o. very pleasant male patient with Body mass index is 25.1 kg/m. who presents with the following:  F/u R shoulder pain and injury: I remember this nice gentleman well.  A month ago he had a subluxation event and had some quite significant shoulder pain with some limitation in his motion in multiple directions.  I started him on some range of motion exercises.  At this point he returns to the office, and he still has some significant loss of motion and weakness in the plane of abduction.  His pain is ongoing and may be worse compared to his prior exam.  Still in a lot of pain. More since recently hurt his shoulder again.  Lab Results  Component Value Date   HGBA1C 6.7 (H) 08/18/2019     07/31/2019 Last OV with Owens Loffler, MD  DOI:  07/29/2019  On Tuesday the patient fell with his arm in abduction and his arm was pulled posteriorly.  At that time he had some very significant pain.  He did feel as if his shoulder moved in the socket.  But it did not dislocate.  Since this time he has had pain in and around the upper chest, the deltoid as well as somewhat posterior of the shoulder.  Initially had difficulty abducting it and what as well as  flexing it, but this has improved compared to the initial injury.  He has no significant prior history of shoulder problems.  No prior dislocations.  Or fractures.  He denies having any radicular symptoms, but he does have some neck discomfort on the right side as well.  [place] Tuesday;  Trouble with R shoulder.  Difficulty with abd and shoulder on the Right.    Review of Systems is noted in the HPI, as appropriate   Objective:   BP 122/86   Pulse 83   Temp 98.4 F (36.9 C) (Temporal)   Ht 5' 9"  (1.753 m)   Wt 170 lb (77.1 kg)   SpO2 97%   BMI 25.10 kg/m   GEN: No acute distress; alert,appropriate. PULM: Breathing comfortably in no respiratory distress PSYCH: Normally interactive.    Right shoulder: Nontender along the clavicle or in the [facility] joint.  In the bicipital groove the patient does have tenderness as well as in the location of the supraspinatus insertion.  Patient has a notable significant pain greater than 35 degrees in the plane of abduction.  This is also present in flexion to a lesser degree.  Strength in abduction is 3+/5 Strength in flexion is 4 -/5.  Internal and external rotation is 5/5.  Drop test is positive.  Neurovascularly intact.  Radiology: DG Shoulder Right  Result Date: 08/22/2019 CLINICAL DATA:  Status  post fall with subsequent right shoulder pain. EXAM: RIGHT SHOULDER - 2+ VIEW COMPARISON:  None. FINDINGS: There is no evidence of fracture or dislocation. There is no evidence of arthropathy or other focal bone abnormality. Soft tissues are unremarkable. IMPRESSION: Negative. Electronically Signed   By: Virgina Norfolk M.D.   On: 08/22/2019 21:54    Assessment and Plan:     ICD-10-CM   1. Tear of right rotator cuff, unspecified tear extent, unspecified whether traumatic  M75.101 MR SHOULDER RIGHT W CONTRAST    DG FLUORO GUIDED NEEDLE PLC ASPIRATION/INJECTION LOC  2. Acute pain of right shoulder  M25.511 MR SHOULDER RIGHT W CONTRAST    DG  FLUORO GUIDED NEEDLE PLC ASPIRATION/INJECTION LOC  3. Shoulder subluxation, right, subsequent encounter  S43.001D MR SHOULDER RIGHT W CONTRAST    DG FLUORO GUIDED NEEDLE PLC ASPIRATION/INJECTION LOC  4. Type II diabetes mellitus, well controlled ([place])  E11.9    Post subluxation event, worsening pain, clinical concern for full-thickness versus partial-thickness rotator cuff tear.  Capsular injury is entirely possible given his clinical scenario post subluxation.  Obtain an MR arthrogram of the right shoulder to evaluate for above.  This will help dictate further plan of care and potential for consultation for surgical event.  Follow-up: No follow-ups on file.  Meds ordered this encounter  Medications  . diclofenac (VOLTAREN) 75 MG EC tablet    Sig: Take 1 tablet (75 mg total) by mouth 2 (two) times daily.    Dispense:  60 tablet    Refill:  2  . traMADol (ULTRAM) 50 MG tablet    Sig: Take 1 tablet (50 mg total) by mouth every 8 (eight) hours as needed for up to 5 days for moderate pain.    Dispense:  20 tablet    Refill:  0   Medications Discontinued During This Encounter  Medication Reason  . diclofenac (VOLTAREN) 75 MG EC tablet    Orders Placed This Encounter  Procedures  . MR SHOULDER RIGHT W CONTRAST  . DG FLUORO GUIDED NEEDLE PLC ASPIRATION/INJECTION LOC    Signed,  Kensleigh Gates T. Kekoa Fyock, MD   Outpatient Encounter Medications as of 08/27/2019  Medication Sig  . aspirin 81 MG tablet Take 81 mg by mouth daily.  Marland Kitchen atorvastatin (LIPITOR) 40 MG tablet Take 1 tablet (40 mg total) by mouth daily.  . Blood Glucose Monitoring Suppl (ONE TOUCH ULTRA 2) w/Device KIT CHECK BLOOD SUGAR DAILY  . fenofibrate 160 MG tablet TAKE 1 TABLET BY MOUTH EVERY DAY  . JANUVIA 100 MG tablet TAKE 1 TABLET BY MOUTH EVERY DAY  . metFORMIN (GLUCOPHAGE) 500 MG tablet Take 1 tablet (500 mg total) by mouth 2 (two) times daily with a meal.  . metroNIDAZOLE (METROGEL) 1 % gel Apply topically daily.  Marland Kitchen  MICROLET LANCETS MISC Check blood sugar 1-2 times daily and as directed. 250.00  . Omega-3 Fatty Acids (FISH OIL) 1000 MG CAPS Take 1 capsule by mouth daily.  . Omeprazole Magnesium (PRILOSEC OTC PO) Take 1 tablet by mouth daily as needed.   Glory Rosebush ULTRA test strip CHECK BLOOD SUGAR DAILY  . traZODone (DESYREL) 50 MG tablet TAKE 1/2 TO 1 TABLETS (25-50 MG TOTAL) BY MOUTH AT BEDTIME AS NEEDED FOR SLEEP.  Marland Kitchen triamcinolone cream (KENALOG) 0.5 % Apply 1 application topically 2 (two) times daily.  . [DISCONTINUED] diclofenac (VOLTAREN) 75 MG EC tablet Take 1 tablet (75 mg total) by mouth 2 (two) times daily.  . diclofenac (VOLTAREN) 75 MG  EC tablet Take 1 tablet (75 mg total) by mouth 2 (two) times daily.  . traMADol (ULTRAM) 50 MG tablet Take 1 tablet (50 mg total) by mouth every 8 (eight) hours as needed for up to 5 days for moderate pain.   No facility-administered encounter medications on file as of 08/27/2019.

## 2019-09-01 ENCOUNTER — Other Ambulatory Visit: Payer: Self-pay | Admitting: Family Medicine

## 2019-09-01 NOTE — Telephone Encounter (Signed)
Fenofibrate 160 mg is not covered by insurance.  Please see list of alternatives.  Pharmacy is asking for choline fenofibrate 135 mg.  Ok to change?

## 2019-09-09 ENCOUNTER — Ambulatory Visit
Admission: RE | Admit: 2019-09-09 | Discharge: 2019-09-09 | Disposition: A | Discharge status: Home / Self Care | Payer: BC Managed Care – PPO | Source: Ambulatory Visit | Attending: Family Medicine | Admitting: Family Medicine

## 2019-09-09 ENCOUNTER — Other Ambulatory Visit: Payer: Self-pay

## 2019-09-09 DIAGNOSIS — X58XXXD Exposure to other specified factors, subsequent encounter: Secondary | ICD-10-CM | POA: Insufficient documentation

## 2019-09-09 DIAGNOSIS — M25511 Pain in right shoulder: Secondary | ICD-10-CM

## 2019-09-09 DIAGNOSIS — M75101 Unspecified rotator cuff tear or rupture of right shoulder, not specified as traumatic: Secondary | ICD-10-CM

## 2019-09-09 DIAGNOSIS — S43001D Unspecified subluxation of right shoulder joint, subsequent encounter: Secondary | ICD-10-CM | POA: Diagnosis present

## 2019-09-09 MED ORDER — DEXTROSE-NACL 5-0.9 % IV SOLN: Freq: Once | INTRAVENOUS | Status: DC

## 2019-09-09 MED ORDER — IOHEXOL 180 MG/ML  SOLN
20.0000 mL | Freq: Once | INTRAMUSCULAR | Status: AC | PRN
  Administered 2019-09-09: 5 mL via INTRA_ARTICULAR

## 2019-09-09 MED ORDER — LIDOCAINE HCL (PF) 1 % IJ SOLN
10.0000 mL | Freq: Once | INTRAMUSCULAR | Status: AC
  Administered 2019-09-09: 5 mL
  Filled 2019-09-09: qty 10

## 2019-09-09 MED ORDER — GADOBUTROL 1 MMOL/ML IV SOLN
2.0000 mL | Freq: Once | INTRAVENOUS | Status: AC | PRN
  Administered 2019-09-09: 2 mL

## 2019-09-10 ENCOUNTER — Other Ambulatory Visit: Payer: Self-pay | Admitting: Family Medicine

## 2019-09-10 DIAGNOSIS — M75121 Complete rotator cuff tear or rupture of right shoulder, not specified as traumatic: Secondary | ICD-10-CM

## 2019-09-10 NOTE — Progress Notes (Signed)
This note is not being shared with the patient for the following reason: To respect privacy (The patient or proxy has requested that the information not be shared).  Consult Dr. Norville Haggard

## 2019-09-27 ENCOUNTER — Other Ambulatory Visit: Payer: Self-pay | Admitting: Family Medicine

## 2019-10-09 ENCOUNTER — Ambulatory Visit: Payer: BC Managed Care – PPO

## 2019-10-16 ENCOUNTER — Other Ambulatory Visit: Payer: Self-pay | Admitting: Orthopedic Surgery

## 2019-10-16 ENCOUNTER — Other Ambulatory Visit: Admission: RE | Admit: 2019-10-16 | Payer: BC Managed Care – PPO | Source: Ambulatory Visit

## 2019-10-17 ENCOUNTER — Other Ambulatory Visit: Payer: Self-pay | Admitting: Family Medicine

## 2019-10-17 ENCOUNTER — Inpatient Hospital Stay: Admission: RE | Admit: 2019-10-17 | Payer: BC Managed Care – PPO | Source: Ambulatory Visit

## 2019-10-20 ENCOUNTER — Encounter
Admission: RE | Admit: 2019-10-20 | Discharge: 2019-10-20 | Disposition: A | Discharge status: Home / Self Care | Payer: BC Managed Care – PPO | Source: Ambulatory Visit | Attending: Orthopedic Surgery | Admitting: Orthopedic Surgery

## 2019-10-20 ENCOUNTER — Other Ambulatory Visit: Payer: Self-pay

## 2019-10-20 DIAGNOSIS — Z01818 Encounter for other preprocedural examination: Secondary | ICD-10-CM | POA: Insufficient documentation

## 2019-10-20 HISTORY — DX: Unspecified osteoarthritis, unspecified site: M19.90

## 2019-10-20 HISTORY — DX: Other specified postprocedural states: R11.2

## 2019-10-20 HISTORY — DX: Other specified postprocedural states: Z98.890

## 2019-10-20 HISTORY — DX: Gastro-esophageal reflux disease without esophagitis: K21.9

## 2019-10-20 NOTE — Patient Instructions (Addendum)
INSTRUCTIONS FOR SURGERY     Your surgery is scheduled for:   Thursday, April 8TH     To find out your arrival time for the day of surgery,          please call [phone] between 1 pm and 3 pm on :  Wednesday, April 7TH    When you arrive for surgery, report to the [facility].       Do NOT stop on the first floor to register.    REMEMBER: Instructions that are not followed completely may result in serious medical risk,  up to and including death, or upon the discretion of your surgeon and anesthesiologist,            your surgery may need to be rescheduled.  __X__ 1. Do not eat food after midnight the night before your procedure.                    No gum, candy, lozenger, tic tacs, tums or hard candies.                  ABSOLUTELY NOTHING SOLID IN YOUR MOUTH AFTER MIDNIGHT                    You may drink unlimited clear liquids up to 2 hours before you are scheduled to arrive for surgery.                   Do not drink anything within those 2 hours unless you need to take medicine, then take the                   smallest amount you need.  Clear liquids include:  water, apple juice without pulp,                   any flavor Gatorade, Black coffee, black tea.  Sugar may be added but no dairy/ honey /lemon.                        Broth and jello is not considered a clear liquid.  __x__  2. On the morning of surgery, please brush your teeth with toothpaste and water. You may rinse with                  mouthwash if you wish but DO NOT SWALLOW TOOTHPASTE OR MOUTHWASH  __X___3. NO alcohol for 24 hours before or after surgery.  __x___ 4.  Do NOT smoke or use e-cigarettes for 24 HOURS PRIOR TO SURGERY.                      DO NOT Use any chewable tobacco products for at least 6 hours prior to surgery.  __x___ 5. If you start any new medication after this appointment and prior to surgery, please    Bring it with you on the day of surgery.  ___x__ 6. Notify your doctor if there is any change in your medical condition, such as fever,  infection, vomitting, diarrhea or any open sores.  __x___ 7.  USE the CHG SOAP as instructed, the night before surgery and the day of surgery.                   Once you have washed with this soap, do NOT use any of the following: Powders, perfumes                    or lotions. Please do not wear make up, hairpins, clips or nail polish. YouMAY wear deodorant.                   Men may shave their face and neck.  Women need to shave 48 hours prior to surgery.                   DO NOT wear ANY jewelry on the day of surgery. If there are rings that are too tight to                    remove easily, please address this prior to the surgery day. Piercings need to be removed.                                                                     NO METAL ON YOUR BODY.                    Do NOT bring any valuables.  If you came to Pre-Admit testing then you will not need license,                     insurance card or credit card.  If you will be staying overnight, please either leave your things in                     the car or have your family be responsible for these items.                     [facility] IS NOT RESPONSIBLE FOR BELONGINGS OR VALUABLES.  ___X__ 8. DO NOT wear contact lenses on surgery day.  You may not have dentures,                     Hearing aides, contacts or glasses in the operating room. These items can be                    Placed in the Recovery Room to receive immediately after surgery.  __x___ 9. IF YOU ARE SCHEDULED TO GO HOME ON THE SAME DAY, YOU MUST                   Have someone to drive you home and to stay with you  for the first 24 hours.                    Have an arrangement prior to arriving on surgery day.  ___x__ 10. Take the following medications on the morning of surgery with a sip of water:  1.  PRILOSEC(take the night time dose and this as an extra dose)                     2.                     3.  __X__  12. STOP ALL ASPIRIN PRODUCTS AS OF TODAY, Monday April 5TH                       THIS INCLUDES BC POWDERS / GOODIES POWDER  __x___ 13. STOP Anti-inflammatories as of: TODAY, Monday April 5TH                      This includes IBUPROFEN / MOTRIN / ADVIL / ALEVE/ NAPROXYN                    YOU MAY TAKE TYLENOL ANY TIME PRIOR TO SURGERY.  __X___ 25.  Stop supplements until after surgery.                     This includes: MULTIVITAMINS / OMEGA 3 FISH OIL / VOLTAREN                 You may continue taking Vitamin B12 / Vitamin D3 but do not take on the morning of surgery.  ______16.  Stop Metformin 2 full days prior to surgery.  Stop on: Monday, April 5TH.                     DO NOT TAKE METFORMIN OR JANUVIA ON SURGERY DAY                     Do NOT take any diabetes medications on surgery day.  ______18.  Wear clean and comfortable clothing to the hospital.                       HAVE A SHIRT THAT IS EASY TO GET ON OVER YOUR HEAD OR AN OVERSIZED                         BUTTON UP SHIRT OR ZIPPER SHIRT.   IF YOU CHOSE TO BRING A CELL PHONE, MAKE SURE IT IS CHARGED. PLEASE BRING PHONE NUMBERS OF YOUR CONTACT PEOPLE IF NOT IN [place].

## 2019-10-21 ENCOUNTER — Encounter
Admission: RE | Admit: 2019-10-21 | Discharge: 2019-10-21 | Disposition: A | Discharge status: Home / Self Care | Payer: BC Managed Care – PPO | Source: Ambulatory Visit | Attending: Orthopedic Surgery | Admitting: Orthopedic Surgery

## 2019-10-21 DIAGNOSIS — Z01818 Encounter for other preprocedural examination: Secondary | ICD-10-CM | POA: Diagnosis not present

## 2019-10-21 DIAGNOSIS — Z20822 Contact with and (suspected) exposure to covid-19: Secondary | ICD-10-CM | POA: Insufficient documentation

## 2019-10-21 DIAGNOSIS — E119 Type 2 diabetes mellitus without complications: Secondary | ICD-10-CM | POA: Diagnosis not present

## 2019-10-21 LAB — BASIC METABOLIC PANEL
Anion gap: 7 (ref 5–15)
BUN: 14 mg/dL (ref 6–20)
CO2: 26 mmol/L (ref 22–32)
Calcium: 9.3 mg/dL (ref 8.9–10.3)
Chloride: 109 mmol/L (ref 98–111)
Creatinine, Ser: 0.88 mg/dL (ref 0.61–1.24)
GFR calc Af Amer: >60 mL/min (ref >60)
GFR calc non Af Amer: >60 mL/min (ref >60)
Glucose, Bld: 144 mg/dL — ABNORMAL HIGH (ref 70–99)
Potassium: 3.6 mmol/L (ref 3.5–5.1)
Sodium: 142 mmol/L (ref 135–145)

## 2019-10-21 LAB — CBC WITH DIFFERENTIAL/PLATELET
Abs Immature Granulocytes: 0.04 10*3/uL (ref 0.00–0.07)
Basophils Absolute: 0.1 10*3/uL (ref 0.0–0.1)
Basophils Relative: 1 %
Eosinophils Absolute: 0.4 10*3/uL (ref 0.0–0.5)
Eosinophils Relative: 4 %
HCT: 32.2 % — ABNORMAL LOW (ref 39.0–52.0)
Hemoglobin: 11 g/dL — ABNORMAL LOW (ref 13.0–17.0)
Immature Granulocytes: 1 %
Lymphocytes Relative: 40 %
Lymphs Abs: 3.5 10*3/uL (ref 0.7–4.0)
MCH: 29.7 pg (ref 26.0–34.0)
MCHC: 34.2 g/dL (ref 30.0–36.0)
MCV: 87 fL (ref 80.0–100.0)
Monocytes Absolute: 0.6 10*3/uL (ref 0.1–1.0)
Monocytes Relative: 7 %
Neutro Abs: 4 10*3/uL (ref 1.7–7.7)
Neutrophils Relative %: 47 %
Platelets: 345 10*3/uL (ref 150–400)
RBC: 3.7 MIL/uL — ABNORMAL LOW (ref 4.22–5.81)
RDW: 13.3 % (ref 11.5–15.5)
WBC: 8.6 10*3/uL (ref 4.0–10.5)
nRBC: 0 % (ref 0.0–0.2)

## 2019-10-21 LAB — PROTIME-INR
INR: 1 (ref 0.8–1.2)
Prothrombin Time: 13 seconds (ref 11.4–15.2)

## 2019-10-21 LAB — APTT: aPTT: 28 seconds (ref 24–36)

## 2019-10-21 LAB — SARS CORONAVIRUS 2 (TAT 6-24 HRS): SARS Coronavirus 2: NEGATIVE

## 2019-10-23 ENCOUNTER — Encounter
Admission: RE | Disposition: A | Discharge status: Home / Self Care | Payer: Self-pay | Source: Home / Self Care | Attending: Orthopedic Surgery

## 2019-10-23 ENCOUNTER — Ambulatory Visit: Payer: BC Managed Care – PPO | Admitting: Anesthesiology

## 2019-10-23 ENCOUNTER — Ambulatory Visit
Admission: RE | Admit: 2019-10-23 | Discharge: 2019-10-23 | Disposition: A | Discharge status: Home / Self Care | Payer: BC Managed Care – PPO | Attending: Orthopedic Surgery | Admitting: Orthopedic Surgery

## 2019-10-23 ENCOUNTER — Ambulatory Visit: Payer: BC Managed Care – PPO

## 2019-10-23 ENCOUNTER — Encounter: Payer: Self-pay | Admitting: Orthopedic Surgery

## 2019-10-23 ENCOUNTER — Other Ambulatory Visit: Payer: Self-pay

## 2019-10-23 DIAGNOSIS — M7541 Impingement syndrome of right shoulder: Secondary | ICD-10-CM | POA: Insufficient documentation

## 2019-10-23 DIAGNOSIS — K219 Gastro-esophageal reflux disease without esophagitis: Secondary | ICD-10-CM | POA: Diagnosis not present

## 2019-10-23 DIAGNOSIS — Z79899 Other long term (current) drug therapy: Secondary | ICD-10-CM | POA: Insufficient documentation

## 2019-10-23 DIAGNOSIS — E119 Type 2 diabetes mellitus without complications: Secondary | ICD-10-CM | POA: Insufficient documentation

## 2019-10-23 DIAGNOSIS — Z7984 Long term (current) use of oral hypoglycemic drugs: Secondary | ICD-10-CM | POA: Insufficient documentation

## 2019-10-23 DIAGNOSIS — E785 Hyperlipidemia, unspecified: Secondary | ICD-10-CM | POA: Diagnosis not present

## 2019-10-23 DIAGNOSIS — M75121 Complete rotator cuff tear or rupture of right shoulder, not specified as traumatic: Secondary | ICD-10-CM | POA: Insufficient documentation

## 2019-10-23 DIAGNOSIS — M7551 Bursitis of right shoulder: Secondary | ICD-10-CM | POA: Insufficient documentation

## 2019-10-23 DIAGNOSIS — Z7982 Long term (current) use of aspirin: Secondary | ICD-10-CM | POA: Insufficient documentation

## 2019-10-23 DIAGNOSIS — S43431A Superior glenoid labrum lesion of right shoulder, initial encounter: Secondary | ICD-10-CM | POA: Diagnosis not present

## 2019-10-23 DIAGNOSIS — M67813 Other specified disorders of tendon, right shoulder: Secondary | ICD-10-CM | POA: Insufficient documentation

## 2019-10-23 DIAGNOSIS — M25511 Pain in right shoulder: Secondary | ICD-10-CM

## 2019-10-23 DIAGNOSIS — X58XXXA Exposure to other specified factors, initial encounter: Secondary | ICD-10-CM | POA: Diagnosis not present

## 2019-10-23 HISTORY — PX: LABRAL REPAIR: SHX5172

## 2019-10-23 HISTORY — PX: BICEPT TENODESIS: SHX5116

## 2019-10-23 HISTORY — PX: SHOULDER ARTHROSCOPY WITH OPEN ROTATOR CUFF REPAIR: SHX6092

## 2019-10-23 HISTORY — PX: SUBACROMIAL DECOMPRESSION: SHX5174

## 2019-10-23 LAB — GLUCOSE, CAPILLARY
Glucose-Capillary: 143 mg/dL — ABNORMAL HIGH (ref 70–99)
Glucose-Capillary: 188 mg/dL — ABNORMAL HIGH (ref 70–99)

## 2019-10-23 LAB — URINE DRUG SCREEN, QUALITATIVE (ARMC ONLY)
Amphetamines, Ur Screen: NOT DETECTED
Barbiturates, Ur Screen: NOT DETECTED
Benzodiazepine, Ur Scrn: NOT DETECTED
Cannabinoid 50 Ng, Ur ~~LOC~~: POSITIVE — AB
Cocaine Metabolite,Ur ~~LOC~~: NOT DETECTED
MDMA (Ecstasy)Ur Screen: NOT DETECTED
Methadone Scn, Ur: NOT DETECTED
Opiate, Ur Screen: NOT DETECTED
Phencyclidine (PCP) Ur S: NOT DETECTED
Tricyclic, Ur Screen: NOT DETECTED

## 2019-10-23 SURGERY — ARTHROSCOPY, SHOULDER WITH REPAIR, ROTATOR CUFF, OPEN
Anesthesia: Regional | Site: Shoulder | Laterality: Right

## 2019-10-23 MED ORDER — PROPOFOL 10 MG/ML IV BOLUS
INTRAVENOUS | Status: DC | PRN
  Administered 2019-10-23: 200 mg via INTRAVENOUS

## 2019-10-23 MED ORDER — FAMOTIDINE 20 MG PO TABS
ORAL_TABLET | ORAL | Status: AC
  Filled 2019-10-23: qty 1

## 2019-10-23 MED ORDER — DEXAMETHASONE SODIUM PHOSPHATE 10 MG/ML IJ SOLN
INTRAMUSCULAR | Status: AC
  Filled 2019-10-23: qty 1

## 2019-10-23 MED ORDER — EPINEPHRINE PF 1 MG/ML IJ SOLN
INTRAMUSCULAR | Status: DC | PRN
  Administered 2019-10-23: 4 mL

## 2019-10-23 MED ORDER — ONDANSETRON HCL 4 MG/2ML IJ SOLN
INTRAMUSCULAR | Status: AC
  Filled 2019-10-23: qty 2

## 2019-10-23 MED ORDER — PROPOFOL 10 MG/ML IV BOLUS
INTRAVENOUS | Status: AC
  Filled 2019-10-23: qty 40

## 2019-10-23 MED ORDER — SODIUM CHLORIDE 0.9 % IV SOLN: INTRAVENOUS | Status: DC

## 2019-10-23 MED ORDER — LIDOCAINE HCL (PF) 1 % IJ SOLN
INTRAMUSCULAR | Status: AC
  Filled 2019-10-23: qty 30

## 2019-10-23 MED ORDER — ONDANSETRON HCL 4 MG/2ML IJ SOLN: 4.0000 mg | Freq: Once | INTRAMUSCULAR | Status: DC | PRN

## 2019-10-23 MED ORDER — ONDANSETRON HCL 4 MG/2ML IJ SOLN
INTRAMUSCULAR | Status: DC | PRN
  Administered 2019-10-23: 4 mg via INTRAVENOUS

## 2019-10-23 MED ORDER — OXYCODONE HCL 5 MG PO TABS: 5.0000 mg | ORAL_TABLET | ORAL | 0 refills | Status: DC | PRN

## 2019-10-23 MED ORDER — SCOPOLAMINE 1 MG/3DAYS TD PT72
MEDICATED_PATCH | TRANSDERMAL | Status: AC
  Administered 2019-10-23: 1.5 mg via TRANSDERMAL
  Filled 2019-10-23: qty 1

## 2019-10-23 MED ORDER — EPINEPHRINE PF 1 MG/ML IJ SOLN
INTRAMUSCULAR | Status: AC
  Filled 2019-10-23: qty 2

## 2019-10-23 MED ORDER — DEXMEDETOMIDINE HCL IN NACL 80 MCG/20ML IV SOLN
INTRAVENOUS | Status: AC
  Filled 2019-10-23: qty 20

## 2019-10-23 MED ORDER — ROCURONIUM BROMIDE 100 MG/10ML IV SOLN
INTRAVENOUS | Status: DC | PRN
  Administered 2019-10-23: 50 mg via INTRAVENOUS
  Administered 2019-10-23: 30 mg via INTRAVENOUS

## 2019-10-23 MED ORDER — FENTANYL CITRATE (PF) 100 MCG/2ML IJ SOLN
INTRAMUSCULAR | Status: AC
  Filled 2019-10-23: qty 2

## 2019-10-23 MED ORDER — FENTANYL CITRATE (PF) 100 MCG/2ML IJ SOLN: 50.0000 ug | Freq: Once | INTRAMUSCULAR | Status: AC

## 2019-10-23 MED ORDER — CEFAZOLIN SODIUM-DEXTROSE 2-4 GM/100ML-% IV SOLN
2.0000 g | INTRAVENOUS | Status: AC
  Administered 2019-10-23: 2 g via INTRAVENOUS

## 2019-10-23 MED ORDER — DEXMEDETOMIDINE HCL 200 MCG/2ML IV SOLN
INTRAVENOUS | Status: DC | PRN
  Administered 2019-10-23: 12 ug via INTRAVENOUS
  Administered 2019-10-23: 8 ug via INTRAVENOUS

## 2019-10-23 MED ORDER — BUPIVACAINE LIPOSOME 1.3 % IJ SUSP
INTRAMUSCULAR | Status: AC
  Filled 2019-10-23: qty 20

## 2019-10-23 MED ORDER — FENTANYL CITRATE (PF) 100 MCG/2ML IJ SOLN
INTRAMUSCULAR | Status: AC
  Administered 2019-10-23: 50 ug via INTRAVENOUS
  Filled 2019-10-23: qty 2

## 2019-10-23 MED ORDER — CHLORHEXIDINE GLUCONATE CLOTH 2 % EX PADS: 6.0000 | MEDICATED_PAD | Freq: Once | CUTANEOUS | Status: DC

## 2019-10-23 MED ORDER — FENTANYL CITRATE (PF) 100 MCG/2ML IJ SOLN
INTRAMUSCULAR | Status: DC | PRN
  Administered 2019-10-23 (×7): 25 ug via INTRAVENOUS

## 2019-10-23 MED ORDER — ONDANSETRON HCL 4 MG PO TABS: 4.0000 mg | ORAL_TABLET | Freq: Three times a day (TID) | ORAL | 0 refills | Status: DC | PRN

## 2019-10-23 MED ORDER — LIDOCAINE HCL (CARDIAC) PF 100 MG/5ML IV SOSY
PREFILLED_SYRINGE | INTRAVENOUS | Status: DC | PRN
  Administered 2019-10-23: 40 mg via INTRAVENOUS

## 2019-10-23 MED ORDER — DEXAMETHASONE SODIUM PHOSPHATE 10 MG/ML IJ SOLN
INTRAMUSCULAR | Status: DC | PRN
  Administered 2019-10-23: 10 mg via INTRAVENOUS

## 2019-10-23 MED ORDER — FENTANYL CITRATE (PF) 100 MCG/2ML IJ SOLN: 25.0000 ug | INTRAMUSCULAR | Status: DC | PRN

## 2019-10-23 MED ORDER — LIDOCAINE HCL (PF) 1 % IJ SOLN
INTRAMUSCULAR | Status: AC
  Filled 2019-10-23: qty 5

## 2019-10-23 MED ORDER — PHENYLEPHRINE HCL (PRESSORS) 10 MG/ML IV SOLN: INTRAVENOUS | Status: DC | PRN

## 2019-10-23 MED ORDER — CEFAZOLIN SODIUM-DEXTROSE 2-4 GM/100ML-% IV SOLN
INTRAVENOUS | Status: AC
  Filled 2019-10-23: qty 100

## 2019-10-23 MED ORDER — BUPIVACAINE HCL (PF) 0.25 % IJ SOLN
INTRAMUSCULAR | Status: AC
  Filled 2019-10-23: qty 30

## 2019-10-23 MED ORDER — MIDAZOLAM HCL 2 MG/2ML IJ SOLN: 1.0000 mg | Freq: Once | INTRAMUSCULAR | Status: AC

## 2019-10-23 MED ORDER — EPHEDRINE SULFATE 50 MG/ML IJ SOLN
INTRAMUSCULAR | Status: DC | PRN
  Administered 2019-10-23 (×2): 10 mg via INTRAVENOUS

## 2019-10-23 MED ORDER — BUPIVACAINE HCL (PF) 0.5 % IJ SOLN
INTRAMUSCULAR | Status: AC
  Filled 2019-10-23: qty 10

## 2019-10-23 MED ORDER — SCOPOLAMINE 1 MG/3DAYS TD PT72: 1.0000 | MEDICATED_PATCH | TRANSDERMAL | Status: DC

## 2019-10-23 MED ORDER — PHENYLEPHRINE HCL-NACL 20-0.9 MG/250ML-% IV SOLN
INTRAVENOUS | Status: DC | PRN
  Administered 2019-10-23: 20 ug/min via INTRAVENOUS

## 2019-10-23 MED ORDER — SEVOFLURANE IN SOLN
RESPIRATORY_TRACT | Status: AC
  Filled 2019-10-23: qty 250

## 2019-10-23 MED ORDER — NEOMYCIN-POLYMYXIN B GU 40-200000 IR SOLN
Status: DC | PRN
  Administered 2019-10-23: 2 mL

## 2019-10-23 MED ORDER — MIDAZOLAM HCL 2 MG/2ML IJ SOLN
INTRAMUSCULAR | Status: AC
  Administered 2019-10-23: 07:00:00 1 mg via INTRAVENOUS
  Filled 2019-10-23: qty 2

## 2019-10-23 SURGICAL SUPPLY — 72 items
ADAPTER IRRIG TUBE 2 SPIKE SOL (ADAPTER) ×4 IMPLANT
ANCHOR ALL-SUT Q-FIX 2.8 (Anchor) ×4 IMPLANT
ANCHOR SUT 5.5 MULTIFIX (Orthopedic Implant) ×6 IMPLANT
ANCHOR SUT BIOC ST 3X145 (Anchor) ×10 IMPLANT
BUR RADIUS 4.0X18.5 (BURR) ×2 IMPLANT
BUR RADIUS 5.5 (BURR) ×2 IMPLANT
CANISTER SUCT LVC 12 LTR MEDI- (MISCELLANEOUS) ×2 IMPLANT
CANNULA 5.75X7 CRYSTAL CLEAR (CANNULA) ×4 IMPLANT
CANNULA PARTIAL THREAD 2X7 (CANNULA) ×4 IMPLANT
CANNULA TWIST IN 8.25X9CM (CANNULA) ×4 IMPLANT
CONNECTOR PERFECT PASSER (CONNECTOR) ×6 IMPLANT
COOLER POLAR GLACIER W/PUMP (MISCELLANEOUS) ×2 IMPLANT
COVER WAND RF STERILE (DRAPES) ×2 IMPLANT
CRADLE LAMINECT ARM (MISCELLANEOUS) ×4 IMPLANT
DEVICE SUCT BLK HOLE OR FLOOR (MISCELLANEOUS) ×4 IMPLANT
DRAPE 3/4 80X56 (DRAPES) ×2 IMPLANT
DRAPE IMP U-DRAPE 54X76 (DRAPES) ×4 IMPLANT
DRAPE INCISE IOBAN 66X45 STRL (DRAPES) ×2 IMPLANT
DRAPE U-SHAPE 47X51 STRL (DRAPES) ×2 IMPLANT
DURAPREP 26ML APPLICATOR (WOUND CARE) ×6 IMPLANT
ELECT REM PT RETURN 9FT ADLT (ELECTROSURGICAL) ×2
ELECTRODE REM PT RTRN 9FT ADLT (ELECTROSURGICAL) ×1 IMPLANT
GAUZE SPONGE 4X4 12PLY STRL (GAUZE/BANDAGES/DRESSINGS) ×2 IMPLANT
GAUZE XEROFORM 1X8 LF (GAUZE/BANDAGES/DRESSINGS) ×2 IMPLANT
GLOVE BIOGEL PI IND STRL 9 (GLOVE) ×1 IMPLANT
GLOVE BIOGEL PI INDICATOR 9 (GLOVE) ×1
GLOVE SURG 9.0 ORTHO LTXF (GLOVE) ×6 IMPLANT
GOWN STRL REUS TWL 2XL XL LVL4 (GOWN DISPOSABLE) ×2 IMPLANT
GOWN STRL REUS W/ TWL LRG LVL3 (GOWN DISPOSABLE) ×1 IMPLANT
GOWN STRL REUS W/TWL LRG LVL3 (GOWN DISPOSABLE) ×1
IV LACTATED RINGER IRRG 3000ML (IV SOLUTION) ×33
IV LR IRRIG 3000ML ARTHROMATIC (IV SOLUTION) ×33 IMPLANT
KIT STABILIZATION SHOULDER (MISCELLANEOUS) ×2 IMPLANT
KIT SUTURE 2.8 Q-FIX DISP (MISCELLANEOUS) ×2 IMPLANT
KIT SUTURETAK 3.0 INSERT PERC (KITS) ×4 IMPLANT
KIT TURNOVER KIT A (KITS) ×2 IMPLANT
MANIFOLD NEPTUNE II (INSTRUMENTS) ×2 IMPLANT
MASK FACE SPIDER DISP (MASK) ×2 IMPLANT
MAT ABSORB  FLUID 56X50 GRAY (MISCELLANEOUS) ×3
MAT ABSORB FLUID 56X50 GRAY (MISCELLANEOUS) ×3 IMPLANT
NDL SAFETY ECLIPSE 18X1.5 (NEEDLE) ×1 IMPLANT
NEEDLE HYPO 18GX1.5 SHARP (NEEDLE) ×1
NEEDLE HYPO 22GX1.5 SAFETY (NEEDLE) ×2 IMPLANT
NS IRRIG 500ML POUR BTL (IV SOLUTION) ×2 IMPLANT
PACK ARTHROSCOPY SHOULDER (MISCELLANEOUS) ×2 IMPLANT
PAD ABD DERMACEA PRESS 5X9 (GAUZE/BANDAGES/DRESSINGS) ×2 IMPLANT
PAD WRAPON POLAR SHDR XLG (MISCELLANEOUS) ×1 IMPLANT
PASSER SUT CAPTURE FIRST (SUTURE) ×2 IMPLANT
PASSER SUT FIRSTPASS SELF (INSTRUMENTS) ×4 IMPLANT
SET TUBE SUCT SHAVER OUTFL 24K (TUBING) ×2 IMPLANT
SET TUBE TIP INTRA-ARTICULAR (MISCELLANEOUS) ×2 IMPLANT
STRIP CLOSURE SKIN 1/2X4 (GAUZE/BANDAGES/DRESSINGS) ×2 IMPLANT
SUT ETHILON 4-0 (SUTURE) ×2
SUT ETHILON 4-0 FS2 18XMFL BLK (SUTURE) ×2
SUT LASSO 90 DEG SD STR (SUTURE) ×6 IMPLANT
SUT MNCRL 4-0 (SUTURE) ×1
SUT MNCRL 4-0 27XMFL (SUTURE) ×1
SUT PDS AB 0 CT1 27 (SUTURE) ×6 IMPLANT
SUT PERFECTPASSER WHITE CART (SUTURE) ×18 IMPLANT
SUT SMART STITCH CARTRIDGE (SUTURE) ×12 IMPLANT
SUT VIC AB 0 CT1 36 (SUTURE) ×6 IMPLANT
SUT VIC AB 2-0 CT2 27 (SUTURE) ×2 IMPLANT
SUTURE ETHLN 4-0 FS2 18XMF BLK (SUTURE) ×2 IMPLANT
SUTURE MAGNUM WIRE 2X48 BLK (SUTURE) IMPLANT
SUTURE MNCRL 4-0 27XMF (SUTURE) ×1 IMPLANT
SYR 10ML LL (SYRINGE) ×2 IMPLANT
TAPE MICROFOAM 4IN (TAPE) ×2 IMPLANT
TOWEL OR 17X26 4PK STRL BLUE (TOWEL DISPOSABLE) ×2 IMPLANT
TUBING ARTHRO INFLOW-ONLY STRL (TUBING) ×2 IMPLANT
TUBING CONNECTING 10 (TUBING) ×2 IMPLANT
WAND HAND CNTRL MULTIVAC 90 (MISCELLANEOUS) ×2 IMPLANT
WRAPON POLAR PAD SHDR XLG (MISCELLANEOUS) ×2

## 2019-10-23 NOTE — Discharge Instructions (Signed)
AMBULATORY SURGERY  DISCHARGE INSTRUCTIONS   1) The drugs that you were given will stay in your system until tomorrow so for the next 24 hours you should not:  A) Drive an automobile B) Make any legal decisions C) Drink any alcoholic beverage   2) You may resume regular meals tomorrow.  Today it is better to start with liquids and gradually work up to solid foods.  You may eat anything you prefer, but it is better to start with liquids, then soup and crackers, and gradually work up to solid foods.   3) Please notify your doctor immediately if you have any unusual bleeding, trouble breathing, redness and pain at the surgery site, drainage, fever, or pain not relieved by medication.    4) Additional Instructions:   Please contact your physician with any problems or Same Day Surgery at [phone], Monday through Friday 6 am to 4 pm, or [facility] number at [phone].     Interscalene Nerve Block with Exparel  1.  For your surgery you have received an Interscalene Nerve Block with Exparel. 2. Nerve Blocks affect many types of nerves, including nerves that control movement, pain and normal sensation.  You may experience feelings such as numbness, tingling, heaviness, weakness or the inability to move your arm or the feeling or sensation that your arm has "fallen asleep". 3. A nerve block with Exparel can last up to 5 days.  Usually the weakness wears off first.  The tingling and heaviness usually wear off next.  Finally you may start to notice pain.  Keep in mind that this may occur in any order.  Once a nerve block starts to wear off it is usually completely gone within 60 minutes. 4. ISNB may cause mild shortness of breath, a hoarse voice, blurry vision, unequal pupils, or drooping of the face on the same side as the nerve block.  These symptoms will usually resolve with the numbness.  Very rarely the procedure itself can cause mild seizures. 5. If needed, your  surgeon will give you a prescription for pain medication.  It will take about 60 minutes for the oral pain medication to become fully effective.  So, it is recommended that you start taking this medication before the nerve block first begins to wear off, or when you first begin to feel discomfort. 6. Take your pain medication only as prescribed.  Pain medication can cause sedation and decrease your breathing if you take more than you need for the level of pain that you have. 7. Nausea is a common side effect of many pain medications.  You may want to eat something before taking your pain medicine to prevent nausea. 8. After an Interscalene nerve block, you cannot feel pain, pressure or extremes in temperature in the effected arm.  Because your arm is numb it is at an increased risk for injury.  To decrease the possibility of injury, please practice the following:  a. While you are awake change the position of your arm frequently to prevent too much pressure on any one area for prolonged periods of time. b.  If you have a cast or tight dressing, check the color or your fingers every couple of hours.  Call your surgeon with the appearance of any discoloration (white or blue). c. If you are given a sling to wear before you go home, please wear it  at all times until the block has completely worn off.  Do not get up at   get up at night without your sling. d. Please contact [facility] or your surgeon if you do not begin to regain sensation after 7 days from the surgery.  Anesthesia may be contacted by calling the Same Day Surgery Department, Mon. through Fri., 6 am to 4 pm at [phone].   e. If you experience any other problems or concerns, please contact your surgeon's office. If you experience severe or prolonged shortness of breath go to the nearest emergency department.

## 2019-10-23 NOTE — Transfer of Care (Signed)
Immediate Anesthesia Transfer of Care Note  Patient: Joseph Lam  Procedure(s) Performed: SHOULDER ARTHROSCOPY WITH OPEN ROTATOR CUFF REPAIR (Right Shoulder) LABRAL REPAIR OPEN (Right Shoulder) BICEPS TENODESIS (Right Shoulder) SUBACROMIAL DECOMPRESSION (Right Shoulder)  Patient Location: PACU  Anesthesia Type:General and Regional  Level of Consciousness: drowsy  Airway & Oxygen Therapy: Patient Spontanous Breathing and Patient connected to face mask oxygen  Post-op Assessment: Report given to RN and Post -op Vital signs reviewed and stable  Post vital signs: Reviewed and stable  Last Vitals:  Vitals Value Taken Time  BP 109/72 10/23/19 1252  Temp    Pulse 74 10/23/19 1256  Resp 19 10/23/19 1256  SpO2 97 % 10/23/19 1256  Vitals shown include unvalidated device data.  Last Pain:  Vitals:   10/23/19 0629  PainSc: 0-No pain         Complications: No apparent anesthesia complications

## 2019-10-23 NOTE — H&P (Addendum)
PREOPERATIVE H&P  Chief Complaint: Right Shoulder Rotator Cuff Tear, Biceps Subluxation with SLAP tear  HPI: Joseph Lam is a 54 y.o. male who presents for preoperative history and physical with a diagnosis of Right Shoulder Rotator Cuff Tear, Biceps Subluxation with SLAP tear. Symptoms of pain, weakness and limited ROM are significantly impairing activities of daily living.  Patient had MRI confirmation of a full thickness tear and failed non-op treatment  He has agreed with surgical management.   Past Medical History:  Diagnosis Date  . Arthritis    shoulder  . Diabetes mellitus ([place])   . GERD (gastroesophageal reflux disease)   . Hyperlipidemia   . PONV (postoperative nausea and vomiting)   . Substance abuse [facility])    Past Surgical History:  Procedure Laterality Date  . HERNIA REPAIR     umbilical repair  . TONSILLECTOMY     as a child   Social History   Socioeconomic History  . Marital status: Married    Spouse name: Joseph Lam  . Number of children: 3  . Years of education: Not on file  . Highest education level: Not on file  Occupational History  . Not on file  Tobacco Use  . Smoking status: Never Smoker  . Smokeless tobacco: Never Used  Substance and Sexual Activity  . Alcohol use: Yes    Alcohol/week: 1.0 standard drinks    Types: 1 Cans of beer per week    Comment: 4-6 per month  . Drug use: Yes    Types: Marijuana  . Sexual activity: Not on file  Other Topics Concern  . Not on file  Social History Narrative   Married, 3 kids   Social Determinants of Health   Financial Resource Strain:   . Difficulty of Paying Living Expenses:   Food Insecurity:   . Worried About Charity fundraiser in the Last Year:   . Arboriculturist in the Last Year:   Transportation Needs:   . Film/video editor (Medical):   Marland Kitchen Lack of Transportation (Non-Medical):   Physical Activity:   . Days of Exercise per Week:   . Minutes of Exercise per Session:   Stress:   .  Feeling of Stress :   Social Connections:   . Frequency of Communication with Friends and Family:   . Frequency of Social Gatherings with Friends and Family:   . Attends Religious Services:   . Active Member of Clubs or Organizations:   . Attends Archivist Meetings:   Marland Kitchen Marital Status:    Family History  Problem Relation Age of Onset  . Cancer Father 62       pancreatic cancer  . Depression Sister   . Depression Sister   . Colon cancer Neg Hx    No Known Allergies Prior to Admission medications   Medication Sig Start Date End Date Taking? Authorizing Provider  acetaminophen (TYLENOL) 500 MG tablet Take 1,000 mg by mouth in the morning and at bedtime.   Yes [provider]  aspirin 81 MG tablet Take 81 mg by mouth daily.   Yes [provider]  atorvastatin (LIPITOR) 40 MG tablet Take 1 tablet (40 mg total) by mouth daily. Patient taking differently: Take 40 mg by mouth at bedtime.  02/19/19  Yes Bedsole, Amy E, MD  Blood Glucose Monitoring Suppl (ONE TOUCH ULTRA 2) w/Device KIT CHECK BLOOD SUGAR DAILY 09/12/17  Yes [provider]  Choline Fenofibrate (FENOFIBRIC ACID) 135 MG  CPDR Take 1 tablet by mouth daily. Patient taking differently: Take 135 mg by mouth at bedtime.  09/02/19  Yes Bedsole, Amy E, MD  diclofenac (VOLTAREN) 75 MG EC tablet Take 1 tablet (75 mg total) by mouth 2 (two) times daily. Patient taking differently: Take 75 mg by mouth at bedtime.  08/27/19  Yes Copland, Frederico Hamman, MD  ibuprofen (ADVIL) 200 MG tablet Take 800 mg by mouth in the morning and at bedtime.   Yes [provider]  JANUVIA 100 MG tablet TAKE 1 TABLET BY MOUTH EVERY DAY 10/20/19  Yes Bedsole, Amy E, MD  metFORMIN (GLUCOPHAGE) 500 MG tablet Take 1 tablet (500 mg total) by mouth 2 (two) times daily with a meal. Patient taking differently: Take 1,000 mg by mouth at bedtime.  04/14/19  Yes Bedsole, Amy E, MD  metroNIDAZOLE (METROGEL) 1 % gel Apply topically  daily. Patient taking differently: Apply 1 application topically daily as needed (Irritation).  01/18/18  Yes Bedsole, Amy E, MD  Multiple Vitamins-Minerals (MULTIVITAMIN WITH MINERALS) tablet Take 1 tablet by mouth at bedtime.   Yes [provider]  Omega-3 Fatty Acids (FISH OIL) 1000 MG CAPS Take 1,000 mg by mouth at bedtime.    Yes [provider]  Omeprazole Magnesium (PRILOSEC OTC PO) Take 20 mg by mouth at bedtime.    Yes [provider]  ONETOUCH ULTRA test strip CHECK BLOOD SUGAR DAILY 12/16/18  Yes Bedsole, Amy E, MD  traZODone (DESYREL) 50 MG tablet TAKE 1/2 TO 1 TABLET (25-50 MG TOTAL) BY MOUTH AT BEDTIME AS NEEDED FOR SLEEP. Patient taking differently: Take 25 mg by mouth at bedtime.  09/29/19  Yes Bedsole, Amy E, MD  MICROLET LANCETS MISC Check blood sugar 1-2 times daily and as directed. 250.00 05/02/12   Bedsole, Amy E, MD  triamcinolone cream (KENALOG) 0.5 % Apply 1 application topically 2 (two) times daily. Patient not taking: Reported on 10/13/2019 08/06/15   Jinny Sanders, MD     Positive ROS: All other systems have been reviewed and were otherwise negative with the exception of those mentioned in the HPI and as above.  Physical Exam: General: Alert, no acute distress Cardiovascular: Regular rate and rhythm, no murmurs rubs or gallops.  No pedal edema Respiratory: Clear to auscultation bilaterally, no wheezes rales or rhonchi. No cyanosis, no use of accessory musculature GI: No organomegaly, abdomen is soft and non-tender nondistended with positive bowel sounds. Skin: Skin intact, no lesions within the operative field. Neurologic: Sensation intact distally Psychiatric: Patient is competent for consent with normal mood and affect Lymphatic: No cervical lymphadenopathy  MUSCULOSKELETAL: Right shoulder: Skin is intact.  Patient has no erythema ecchymosis swelling or deformity.  There is no obvious muscle atrophy.  Patient has pain with abduction at 90  degrees and above.  He has pain with a downward directed force on his abducted shoulder.  He demonstrates weakness abduction and to a lesser extent external rotation.  Patient has tenderness over the biceps tendon in the area of the intertuberous groove.  He has positive impingement signs but no apprehension instability.  He has full digital wrist and elbow range of motion, intact/light touch and a palpable radial pulse.  He has pain with speeds test and  O'Brien's test.  Assessment: Right Shoulder Rotator Cuff Tear, Biceps Subluxation with SLAP tear  Plan: Plan for Procedure(s): RIGHT SHOULDER ARTHROSCOPIC SUBACROMIAL DECOMPRESSION, DISTAL CLAVICLE EXCISION WITH MINI-OPEN ROTATOR CUFF REPAIR AND BICEPS TENOTOMY  I explained the details of the  operation as well as the postoperative course with the patient.  I discussed the risks and benefits of surgery.  The risks include but are not limited to infection, bleeding requiring blood transfusion, nerve or blood vessel injury, joint stiffness or loss of motion, persistent pain, weakness or instability, retear of the rotator cuff, failure of the repair and the need for further surgery. Medical risks include but are not limited to DVT and pulmonary embolism, myocardial infarction, stroke, pneumonia, respiratory failure and death. Patient understood these risks and wished to proceed.     Thornton Park, MD   10/23/2019 7:50 AM

## 2019-10-23 NOTE — Anesthesia Preprocedure Evaluation (Addendum)
Anesthesia Evaluation  Patient identified by MRN, date of birth, ID band Patient awake    Reviewed: Allergy & Precautions, NPO status , Patient's Chart, lab work & pertinent test results  History of Anesthesia Complications (+) PONV and history of anesthetic complications  Airway Mallampati: II  TM Distance: >3 FB     Dental   Pulmonary asthma ,    Pulmonary exam normal        Cardiovascular negative cardio ROS Normal cardiovascular exam     Neuro/Psych negative neurological ROS     GI/Hepatic GERD  ,(+)     substance abuse  cocaine use,   Endo/Other  diabetes  Renal/GU negative Renal ROS     Musculoskeletal  (+) Arthritis , Osteoarthritis,    Abdominal Normal abdominal exam  (+)   Peds negative pediatric ROS (+)  Hematology negative hematology ROS (+)   Anesthesia Other Findings   Reproductive/Obstetrics                             Anesthesia Physical Anesthesia Plan  ASA: II  Anesthesia Plan: General   Post-op Pain Management:  Regional for Post-op pain   Induction: Intravenous  PONV Risk Score and Plan:   Airway Management Planned: Oral ETT  Additional Equipment:   Intra-op Plan:   Post-operative Plan: Extubation in OR  Informed Consent: I have reviewed the patients History and Physical, chart, labs and discussed the procedure including the risks, benefits and alternatives for the proposed anesthesia with the patient or authorized representative who has indicated his/her understanding and acceptance.     Dental advisory given  Plan Discussed with: CRNA and Surgeon  Anesthesia Plan Comments: (Discussed interscalene block with the patient and elucidated the risks in detail which include nerve damage, infection, pneumothorax, difficulty breathing, seizures, cardiac arrest and block not working well.  Patient understands risks and procedure and agrees to proceed with  the block for postop pain as requested by the surgeon.)       Anesthesia Quick Evaluation

## 2019-10-23 NOTE — Op Note (Signed)
10/23/2019  5:54 PM  PATIENT:  Joseph Lam  54 y.o. male  PRE-OPERATIVE DIAGNOSIS:  Right Shoulder Rotator Cuff Tear, Biceps Subluxation with SLAP tear  POST-OPERATIVE DIAGNOSIS:   1.  Right shoulder full-thickness rotator cuff tear involving the supra and infraspinatus with fraying of the superior fibers of the subscapularis 2.  Tendinosis of the long head of the biceps with medial subluxation 3.  SLAP and anterior inferior labral tears 4.  Subacromial impingement with extensive subacromial bursitis  PROCEDURE:  Procedure(s): 1.  RIGHT SHOULDER ARTHROSCOPIC SLAP AND ANTERIOR LABRAL REPAIR 2.  MINI-OPEN ROTATOR CUFF REPAIR  3.  MINI-OPEN BICEPS TENODESIS 4.  EXTENSIVE BURSAL DEBRIDEMENT AND SUBACROMIAL DECOMPRESSION   SURGEON:  Surgeon(s) and Role:    Thornton Park, MD - Primary  ANESTHESIA:   local and paracervical block   PREOPERATIVE INDICATIONS:  Joseph Lam is a  54 y.o. male with a diagnosis of Right Shoulder Rotator Cuff Tear, Biceps Subluxation with SLAP tear, confirmed by MR arthrogram, who failed nonoperative treatment and elected for surgical repair of the right shoulder rotator cuff.  The risks benefits and alternatives were discussed with the patient preoperatively including but not limited to the risks of infection, bleeding, nerve injury, persistent pain or weakness, failure of the hardware, re-tear of the rotator cuff and the need for further surgery. Medical risks include DVT and pulmonary embolism, myocardial infarction, stroke, pneumonia, respiratory failure and death. Patient understood these risks and wished to proceed.  OPERATIVE IMPLANTS: [place] Multifix anchors x 2 & Smith [employer] Nephew Q Fix anchors x 2   Arthrex bio suturetak anchors  5   OPERATIVE PROCEDURE: The patient was met in the preoperative area. The right shoulder was signed with the word yes and my initials according the hospital's correct site of surgery protocol.  A preop history  and physical was performed at the bedside.  Patient received a right shoulder interscalene block by the anesthesia service with Exparel in the preoperative area.  The patient was brought to the OR and underwent general endotracheal intubation by the anesthesia service. He was placed in a beachchair position. A spider arm positioner was used for this case. Examination under anesthesia revealed significant anterior translation of the humeral head in relation to the glenoid on load-and-shift testing.  Patient had a mild sulcus sign.  There was no limitation of passive range of motion of the right shoulder.  The patient was prepped and draped in a sterile fashion. A timeout was performed to verify the patient's name, date of birth, medical record number, correct site of surgery and correct procedure to be performed there was also used to verify the patient received antibiotics that all appropriate instruments, implants and radiographs studies were available in the room. Once all in attendance were in agreement case began.  Bony landmarks were drawn out with a surgical marker along with proposed arthroscopy incisions.  An 11 blade was used to establish a posterior portal through which the arthroscope was placed in the glenohumeral joint. A full diagnostic examination of the shoulder was performed. The anterior portal was established under direct visualization with an 18-gauge spinal needle.  A 5.75 mm arthroscopic cannula was placed through the anterior portal.  A full diagnostic evaluation of the right glenohumeral joint was performed.  The findings on arthroscopy are noted above.  The patient was found to have a large SLAP tear destabilizing the biceps anchor.  The intra-articular portion of the biceps tendon was found  to be significantly thickened tendinosis.   A lateral portal was established under direct visualization using an 18-gauge spinal needle.  Through the rotator cuff tear a perfect pass suture was  placed into the biceps tendon.  A biceps tenotomy was performed using a 90 degree ArthroCare wand and arthroscopic scissor.  The suture through the biceps was clamped for later biceps tenodesis.  A 4.0 mm resector shaver blade was used to debride the frayed edges of the superior labrum. Subscapularis tendon was intact. Patient had a full-thickness tear involving the supraspinatus and infraspinatus with proximally a centimeter of retraction from the greater tuberosity footprint. There were no loose bodies within the inferior recess and no evidence of HAGL lesion.  The anterior labrum was probed and found to have a tear off the anterior inferior glenoid.  The superior labral tear was extensive and destabilize the superior labrum which could displace into the glenohumeral joint.  The decision was made to repair both the SLAP and anterior labral tears.  A 7 mm cannula was placed through this anterolateral portal. The nonarticular portion of the superior glenoid was debrided with a 4.0  resector shaver blade.  Through the lateral portal, using the rotator cuff tear for access to the superior glenoid, three Arthrex Biosuture tak anchors were placed into the superior glenoid.  During the repair one of the three anchors was accidentally unloaded.  The superior labrum was therefore stabilized with one Arthrex Bio Suturetak anchor placed in the anterior superior labrum and a second anchor placed in the posterior superior labrum.  A single limb of each of the Arthrex bio suture tack anchors was shuttled under the superior labrum using a 90 degree suture lasso from the anterolateral portal.  An arthroscopic knot tying technique was used to approximate the superior labrum to the superior osseous glenoid.  Once the repair was completed it was probed and found to be stable.  The attention was then turned to repair of the anterior labrum.  Arthroscopic elevators were used to mobilize the anterior inferior labrum.  A 4.0 mm  resector shaver blade was used to debride the interval between the labrum and the osseous glenoid of intervening scar tissue.  The shaver blade was then used on burr mode to debride the nonarticular anterior glenoid until punctate bleeding was identified.  2 Arthrex bio suture tack anchors were then placed into the anterior inferior glenoid at the 5:30 and 3:30 positions.  A single limb of each anchor was shuttled under the anterior labrum with a 90 degree suture lasso.  The anterior inferior labrum was approximated to the glenoid using an arthroscopic knot pusher.  The repair was again probed and found to be stable.  Following SLAP and anterior labral repairs, the humeral head no longer demonstrated excessive passive anterior translation.    The arthroscope was then placed in the subacromial space. A lateral portal was then established using an 18-gauge spinal needle for localization.   The greater tuberosity was debrided using a 5.5 mm resector shaver blade to remove all remaining foreign fibers of the rotator cuff.  Debridement was performed until punctate bleeding was seen at the greater tuberosity footprint.  Extensive bursitis was encountered and debrided using a 4-0 resector shaver blade and a 90 ArthroCare wand from the lateral portal.  The bursa was substantially thickened and bursal debridement was complicated by hypervascularity.  A subacromial decompression was also performed using a 5.5 mm resector shaver blade from the lateral portal. Given that the patient's  MRI had shown only mild AC joint arthrosis, the decision was made not to perform a distal clavicle excision.    Two ArthroCare Perfect Pass sutures were placed in the lateral border of the rotator cuff tear. All arthroscopic instruments were then removed and the mini-open portion of the procedure began.   A saber-type incision was made along the lateral border of the acromion. The deltoid muscle was identified and split in line with its  fibers which allowed visualization of the rotator cuff. The two Perfect Pass sutures previously placed in the lateral border of the rotator cuff werealso brought out through the deltoid split. Two Q-Fix anchors were then placed at the articular margin of the humeral head and greater tuberosity. The four suture limbs of each of the two Q Fix anchors were passed medially through the rotator cuff using a First Pass suture passer. Three additional Perfect Pass sutures were placed in the lateral border of the rotator cuff.  All five Perfect Pass sutures were then anchored to thegreater tuberosity of the humeral head using two [place] Multifix anchors. The sutures were tensioned to allow for anatomic reduction of the rotator cuff to the greater tuberosity footprint prior to manually screwing the anchors into final position in the greater tuberosity. The medial row repair was then completed using an arthroscopic knot tying technique with the sutures from the two Q fix anchors. Once all sutures were tied down, arthroscopic images of the double row repair were taken with the arthroscope both externally and arthroscopically fromthe glenohumeral joint.  A solid, anatomic repair of the rotator cuff was achieved.  All incisions were copiously irrigated. The deltoid fascia was repaired using a 0 Vicryl suturean interrupted fashion. The subcutaneous tissue of all incisions were closed with a 2-0 Vicryl. Skin closure for the arthroscopic incisions was performed with 4-0 nylon. The skin of the saber incision were approximated with a running 4-0 undyed Monocryl. A dry sterile dressing including Steri-Strips was applied. The patient was placed in an abduction sling, with a Polar Care sleeve.  All sharp and instrument counts were correct at the conclusion of the case. I was scrubbed and present for the entire case. I spoke with the patient's wife by phone from the PACU to informed her that the case had been  performed without complication and the patient was stable in recovery room.  I reviewed postoperative instructions with her as well.  I answered all her questions.    Timoteo Gaul, MD

## 2019-10-23 NOTE — Anesthesia Procedure Notes (Signed)
Anesthesia Regional Block: Interscalene brachial plexus block   Pre-Anesthetic Checklist: ,, timeout performed, Correct Patient, Correct Site, Correct Laterality, Correct Procedure, Correct Position, site marked, Risks and benefits discussed,  Surgical consent,  Pre-op evaluation,  At surgeon's request and post-op pain management  Laterality: Right  Prep: chloraprep, alcohol swabs       Needles:  Injection technique: Single-shot  Needle Type: Stimiplex     Needle Length: 5cm  Needle Gauge: 22     Additional Needles:   Procedures:, nerve stimulator,,, ultrasound used (permanent image in chart),,,,   Nerve Stimulator or Paresthesia:  Response: biceps flexion, 0.5 mA,   Additional Responses:   Narrative:  Start time: 10/23/2019 7:28 AM End time: 10/23/2019 7:35 AM Injection made incrementally with aspirations every 5 mL.  Performed by: Personally  Anesthesiologist: Alvin Critchley, MD  Additional Notes: Functioning IV was confirmed and monitors were applied.  A 13mm 22ga Stimuplex needle was used. Sterile prep and drape,hand hygiene and sterile gloves were used.  Negative aspiration and negative test dose prior to incremental administration of local anesthetic. The patient tolerated the procedure well.  Easy injection and no pain on incremental injection.

## 2019-10-23 NOTE — Anesthesia Procedure Notes (Signed)
Procedure Name: Intubation Date/Time: 10/23/2019 8:10 AM Performed by: Gentry Fitz, CRNA Pre-anesthesia Checklist: Patient identified, Emergency Drugs available, Suction available and Patient being monitored Patient Re-evaluated:Patient Re-evaluated prior to induction Oxygen Delivery Method: Circle system utilized Preoxygenation: Pre-oxygenation with 100% oxygen Induction Type: IV induction Ventilation: Mask ventilation without difficulty Laryngoscope Size: Mac and 4 Grade View: Grade I Tube type: Oral Tube size: 7.5 mm Number of attempts: 1 Airway Equipment and Method: Stylet Placement Confirmation: ETT inserted through vocal cords under direct vision,  positive ETCO2 and breath sounds checked- equal and bilateral Secured at: 23 cm Tube secured with: Tape Dental Injury: Teeth and Oropharynx as per pre-operative assessment

## 2019-10-23 NOTE — Anesthesia Postprocedure Evaluation (Signed)
Anesthesia Post Note  Patient: Joseph Lam  Procedure(s) Performed: SHOULDER ARTHROSCOPY WITH OPEN ROTATOR CUFF REPAIR (Right Shoulder) LABRAL REPAIR OPEN (Right Shoulder) BICEPS TENODESIS (Right Shoulder) SUBACROMIAL DECOMPRESSION (Right Shoulder)  Patient location during evaluation: PACU Anesthesia Type: Regional Level of consciousness: awake and alert Pain management: pain level controlled Vital Signs Assessment: post-procedure vital signs reviewed and stable Respiratory status: spontaneous breathing, nonlabored ventilation, respiratory function stable and patient connected to nasal cannula oxygen Cardiovascular status: blood pressure returned to baseline and stable Postop Assessment: no apparent nausea or vomiting Anesthetic complications: no     Last Vitals:  Vitals:   10/23/19 1332 10/23/19 1341  BP: 113/78 109/72  Pulse: 74 73  Resp: 20 15  Temp:  36.7 C  SpO2: 100% 97%    Last Pain:  Vitals:   10/23/19 1332  PainSc: 0-No pain                 Arita Miss

## 2020-01-21 ENCOUNTER — Other Ambulatory Visit: Payer: Self-pay

## 2020-01-21 ENCOUNTER — Emergency Department
Admission: EM | Admit: 2020-01-21 | Discharge: 2020-01-21 | Disposition: A | Discharge status: Home / Self Care | Payer: BC Managed Care – PPO | Attending: Emergency Medicine | Admitting: Emergency Medicine

## 2020-01-21 ENCOUNTER — Telehealth: Payer: Self-pay

## 2020-01-21 ENCOUNTER — Encounter: Payer: Self-pay | Admitting: *Deleted

## 2020-01-21 ENCOUNTER — Emergency Department: Payer: BC Managed Care – PPO

## 2020-01-21 DIAGNOSIS — N201 Calculus of ureter: Secondary | ICD-10-CM

## 2020-01-21 DIAGNOSIS — Z7982 Long term (current) use of aspirin: Secondary | ICD-10-CM | POA: Insufficient documentation

## 2020-01-21 DIAGNOSIS — E119 Type 2 diabetes mellitus without complications: Secondary | ICD-10-CM | POA: Insufficient documentation

## 2020-01-21 DIAGNOSIS — R1031 Right lower quadrant pain: Secondary | ICD-10-CM | POA: Diagnosis present

## 2020-01-21 DIAGNOSIS — Z7984 Long term (current) use of oral hypoglycemic drugs: Secondary | ICD-10-CM | POA: Diagnosis not present

## 2020-01-21 LAB — COMPREHENSIVE METABOLIC PANEL
ALT: 16 U/L (ref 0–44)
AST: 19 U/L (ref 15–41)
Albumin: 4.8 g/dL (ref 3.5–5.0)
Alkaline Phosphatase: 32 U/L — ABNORMAL LOW (ref 38–126)
Anion gap: 14 (ref 5–15)
BUN: 17 mg/dL (ref 6–20)
CO2: 22 mmol/L (ref 22–32)
Calcium: 9.8 mg/dL (ref 8.9–10.3)
Chloride: 102 mmol/L (ref 98–111)
Creatinine, Ser: 1.3 mg/dL — ABNORMAL HIGH (ref 0.61–1.24)
GFR calc Af Amer: >60 mL/min (ref >60)
GFR calc non Af Amer: >60 mL/min (ref >60)
Glucose, Bld: 160 mg/dL — ABNORMAL HIGH (ref 70–99)
Potassium: 2.9 mmol/L — ABNORMAL LOW (ref 3.5–5.1)
Sodium: 138 mmol/L (ref 135–145)
Total Bilirubin: 1.4 mg/dL — ABNORMAL HIGH (ref 0.3–1.2)
Total Protein: 7.7 g/dL (ref 6.5–8.1)

## 2020-01-21 LAB — CBC
HCT: 33.8 % — ABNORMAL LOW (ref 39.0–52.0)
Hemoglobin: 11.9 g/dL — ABNORMAL LOW (ref 13.0–17.0)
MCH: 29.2 pg (ref 26.0–34.0)
MCHC: 35.2 g/dL (ref 30.0–36.0)
MCV: 82.8 fL (ref 80.0–100.0)
Platelets: 404 10*3/uL — ABNORMAL HIGH (ref 150–400)
RBC: 4.08 MIL/uL — ABNORMAL LOW (ref 4.22–5.81)
RDW: 12.7 % (ref 11.5–15.5)
WBC: 16.5 10*3/uL — ABNORMAL HIGH (ref 4.0–10.5)
nRBC: 0 % (ref 0.0–0.2)

## 2020-01-21 LAB — URINALYSIS, COMPLETE (UACMP) WITH MICROSCOPIC
Bacteria, UA: NONE SEEN
Bilirubin Urine: NEGATIVE
Glucose, UA: NEGATIVE mg/dL
Ketones, ur: 5 mg/dL — AB
Leukocytes,Ua: NEGATIVE
Nitrite: NEGATIVE
Protein, ur: NEGATIVE mg/dL
Specific Gravity, Urine: 1.013 (ref 1.005–1.030)
Squamous Epithelial / HPF: NONE SEEN (ref 0–5)
pH: 7 (ref 5.0–8.0)

## 2020-01-21 LAB — LIPASE, BLOOD: Lipase: 30 U/L (ref 11–51)

## 2020-01-21 MED ORDER — HYDROCODONE-ACETAMINOPHEN 5-325 MG PO TABS: 1.0000 | ORAL_TABLET | Freq: Four times a day (QID) | ORAL | 0 refills | Status: DC | PRN

## 2020-01-21 MED ORDER — NAPROXEN 500 MG PO TABS: 500.0000 mg | ORAL_TABLET | Freq: Two times a day (BID) | ORAL | 2 refills | Status: DC

## 2020-01-21 MED ORDER — SODIUM CHLORIDE 0.9 % IV SOLN
1000.0000 mL | Freq: Once | INTRAVENOUS | Status: AC
  Administered 2020-01-21: 1000 mL via INTRAVENOUS

## 2020-01-21 MED ORDER — KETOROLAC TROMETHAMINE 30 MG/ML IJ SOLN
30.0000 mg | Freq: Once | INTRAMUSCULAR | Status: AC
  Administered 2020-01-21: 30 mg via INTRAVENOUS
  Filled 2020-01-21: qty 1

## 2020-01-21 MED ORDER — ONDANSETRON 4 MG PO TBDP
4.0000 mg | ORAL_TABLET | Freq: Once | ORAL | Status: AC | PRN
  Administered 2020-01-21: 4 mg via ORAL
  Filled 2020-01-21: qty 1

## 2020-01-21 MED ORDER — ONDANSETRON HCL 4 MG/2ML IJ SOLN
4.0000 mg | Freq: Once | INTRAMUSCULAR | Status: AC
  Administered 2020-01-21: 4 mg via INTRAVENOUS
  Filled 2020-01-21: qty 2

## 2020-01-21 MED ORDER — TAMSULOSIN HCL 0.4 MG PO CAPS: 0.4000 mg | ORAL_CAPSULE | Freq: Every day | ORAL | 0 refills | Status: DC

## 2020-01-21 MED ORDER — ONDANSETRON 4 MG PO TBDP: 4.0000 mg | ORAL_TABLET | Freq: Three times a day (TID) | ORAL | 0 refills | Status: DC | PRN

## 2020-01-21 NOTE — Telephone Encounter (Signed)
Pt having dull and constant rt lower abd pain starting 01/19/20, N&V, last vomited last night, and constipation; pt does not remember last normal BM. Had fever 2 nights ago but does not think fever now. Pain level now is 8. Pt is going to [facility] ED now; pt request I call for EMS to pick pt up; done and pt has someone with him now so call ended. FYI to Dr Diona Browner.

## 2020-01-21 NOTE — ED Triage Notes (Addendum)
Patient states he last had a scant BM two days ago. Patient is c/o RLQ abdominal pain that has worsened and is nauseous. Patient reports right shoulder surgery approximately 10 weeks ago,.

## 2020-01-21 NOTE — Telephone Encounter (Signed)
DOB and phone # go with Joseph Lam. Sending note to [place] for scheduling.

## 2020-01-21 NOTE — Telephone Encounter (Signed)
Noted  

## 2020-01-21 NOTE — ED Provider Notes (Signed)
  [facility] Emergency Department Provider Note   ____________________________________________    I have reviewed the triage vital signs and the nursing notes.   HISTORY  Chief Complaint Abdominal Pain     HPI Winn W Batiz is a 54 y.o. male with history of diabetes, right shoulder surgery 10 weeks ago, who presents with complaints of right lower quadrant/right flank pain which started abruptly overnight.  He reports the pain has been severe at times.  Currently is improved.  No radiation of pain.  No dysuria.  Has not noticed hematuria.  Positive nausea and vomiting.  No fevers or chills.  Has never had this before.  No history of kidney stones.  Past Medical History:  Diagnosis Date  . Arthritis    shoulder  . Diabetes mellitus (HCC)   . GERD (gastroesophageal reflux disease)   . Hyperlipidemia   . PONV (postoperative nausea and vomiting)   . Substance abuse (HCC)     Patient Active Problem List   Diagnosis Date Noted  . Acute pain of right shoulder 08/22/2019  . Chronic insomnia 02/18/2019  . Rosacea 01/18/2018  . Myopia with astigmatism and presbyopia, bilateral 12/14/2017  . Hyperlipidemia associated with type 2 diabetes mellitus (HCC) 11/07/2012  . GERD (gastroesophageal reflux disease) 04/12/2012  . Type II diabetes mellitus, well controlled (HCC) 02/06/2008  . COCAINE ABUSE, IN REMISSION 02/05/2008  . ALLERGIC RHINITIS 02/05/2008  . Mild intermittent asthma 02/05/2008    Past Surgical History:  Procedure Laterality Date  . BICEPT TENODESIS Right 10/23/2019   Procedure: BICEPS TENODESIS;  Surgeon: Krasinski, Kevin, MD;  Location: [facility] ORS;  Service: Orthopedics;  Laterality: Right;  . HERNIA REPAIR     umbilical repair  . LABRAL REPAIR Right 10/23/2019   Procedure: LABRAL REPAIR OPEN;  Surgeon: Krasinski, Kevin, MD;  Location: [facility] ORS;  Service: Orthopedics;  Laterality: Right;  . SHOULDER ARTHROSCOPY WITH OPEN ROTATOR CUFF REPAIR  Right 10/23/2019   Procedure: SHOULDER ARTHROSCOPY WITH OPEN ROTATOR CUFF REPAIR;  Surgeon: Krasinski, Kevin, MD;  Location: [facility] ORS;  Service: Orthopedics;  Laterality: Right;  . SUBACROMIAL DECOMPRESSION Right 10/23/2019   Procedure: SUBACROMIAL DECOMPRESSION;  Surgeon: Krasinski, Kevin, MD;  Location: [facility] ORS;  Service: Orthopedics;  Laterality: Right;  . TONSILLECTOMY     as a child    Prior to Admission medications   Medication Sig Start Date End Date Taking? Authorizing Provider  acetaminophen (TYLENOL) 500 MG tablet Take 1,000 mg by mouth in the morning and at bedtime.    [provider]  aspirin 81 MG tablet Take 81 mg by mouth daily.    [provider]  atorvastatin (LIPITOR) 40 MG tablet Take 1 tablet (40 mg total) by mouth daily. Patient taking differently: Take 40 mg by mouth at bedtime.  02/19/19   Bedsole, Amy E, MD  Blood Glucose Monitoring Suppl (ONE TOUCH ULTRA 2) w/Device KIT CHECK BLOOD SUGAR DAILY 09/12/17   [provider]  Choline Fenofibrate (FENOFIBRIC ACID) 135 MG CPDR Take 1 tablet by mouth daily. Patient taking differently: Take 135 mg by mouth at bedtime.  09/02/19   Bedsole, Amy E, MD  HYDROcodone-acetaminophen (NORCO/VICODIN) 5-325 MG tablet Take 1 tablet by mouth every 6 (six) hours as needed for severe pain. 01/21/20   Kinner, Robert, MD  ibuprofen (ADVIL) 200 MG tablet Take 800 mg by mouth in the morning and at bedtime.    [provider]  JANUVIA 100 MG tablet TAKE 1 TABLET BY MOUTH EVERY   DAY 10/20/19   Bedsole, Amy E, MD  metFORMIN (GLUCOPHAGE) 500 MG tablet Take 1 tablet (500 mg total) by mouth 2 (two) times daily with a meal. Patient taking differently: Take 1,000 mg by mouth at bedtime.  04/14/19   Bedsole, Amy E, MD  metroNIDAZOLE (METROGEL) 1 % gel Apply topically daily. Patient taking differently: Apply 1 application topically daily as needed (Irritation).  01/18/18   Bedsole, Amy E, MD  MICROLET LANCETS MISC Check blood sugar  1-2 times daily and as directed. 250.00 05/02/12   Bedsole, Amy E, MD  Multiple Vitamins-Minerals (MULTIVITAMIN WITH MINERALS) tablet Take 1 tablet by mouth at bedtime.    [provider]  naproxen (NAPROSYN) 500 MG tablet Take 1 tablet (500 mg total) by mouth 2 (two) times daily with a meal. 01/21/20   Lavonia Drafts, MD  Omega-3 Fatty Acids (FISH OIL) 1000 MG CAPS Take 1,000 mg by mouth at bedtime.     [provider]  Omeprazole Magnesium (PRILOSEC OTC PO) Take 20 mg by mouth at bedtime.     [provider]  ondansetron (ZOFRAN ODT) 4 MG disintegrating tablet Take 1 tablet (4 mg total) by mouth every 8 (eight) hours as needed. 01/21/20   Lavonia Drafts, MD  ondansetron (ZOFRAN) 4 MG tablet Take 1 tablet (4 mg total) by mouth every 8 (eight) hours as needed for nausea or vomiting. 10/23/19   Thornton Park, MD  ONETOUCH ULTRA test strip CHECK BLOOD SUGAR DAILY 12/16/18   Bedsole, Amy E, MD  oxyCODONE (OXY IR/ROXICODONE) 5 MG immediate release tablet Take 1 tablet (5 mg total) by mouth every 4 (four) hours as needed. 10/23/19   Thornton Park, MD  tamsulosin (FLOMAX) 0.4 MG CAPS capsule Take 1 capsule (0.4 mg total) by mouth daily. 01/21/20   Lavonia Drafts, MD  traZODone (DESYREL) 50 MG tablet TAKE 1/2 TO 1 TABLET (25-50 MG TOTAL) BY MOUTH AT BEDTIME AS NEEDED FOR SLEEP. Patient taking differently: Take 25 mg by mouth at bedtime.  09/29/19   Jinny Sanders, MD     Allergies Patient has no known allergies.  Family History  Problem Relation Age of Onset  . Cancer Father 97       pancreatic cancer  . Depression Sister   . Depression Sister   . Colon cancer Neg Hx     Social History Social History   Tobacco Use  . Smoking status: Never Smoker  . Smokeless tobacco: Never Used  Vaping Use  . Vaping Use: Never used  Substance Use Topics  . Alcohol use: Not Currently    Alcohol/week: 1.0 standard drink    Types: 1 Cans of beer per week    Comment: 4-6 per month  .  Drug use: Yes    Types: Marijuana    Review of Systems  Constitutional: No fever/chills Eyes: No visual changes.  ENT: No sore throat. Cardiovascular: Denies chest pain. Respiratory: Denies shortness of breath. Gastrointestinal: As above Genitourinary: As above Musculoskeletal: Continued discomfort right shoulder Skin: Negative for rash. Neurological: Negative for headaches    ____________________________________________   PHYSICAL EXAM:  VITAL SIGNS: ED Triage Vitals  Enc Vitals Group     BP 01/21/20 1129 (!) 165/91     Pulse Rate 01/21/20 1129 70     Resp 01/21/20 1129 20     Temp 01/21/20 1129 98.2 F (36.8 C)     Temp Source 01/21/20 1129 Oral     SpO2 01/21/20 1129 99 %  Weight 01/21/20 1132 74.8 kg (165 lb)     Height 01/21/20 1132 1.778 m (5' 10")     Head Circumference --      Peak Flow --      Pain Score 01/21/20 1132 9     Pain Loc --      Pain Edu? --      Excl. in GC? --     Constitutional: Alert and oriented.   Nose: No congestion/rhinnorhea. Mouth/Throat: Mucous membranes are moist.    Cardiovascular: Normal rate, regular rhythm.   Good peripheral circulation. Respiratory: Normal respiratory effort.  No retractions. Gastrointestinal: Soft and nontender. No distention.  No CVA tenderness.  Reassuring exam Genitourinary: No hernia noted Musculoskeletal: No lower extremity tenderness nor edema.  Warm and well perfused Neurologic:  Normal speech and language. No gross focal neurologic deficits are appreciated.  Skin:  Skin is warm, dry and intact. Psychiatric: Mood and affect are normal. Speech and behavior are normal.  ____________________________________________   LABS (all labs ordered are listed, but only abnormal results are displayed)  Labs Reviewed  COMPREHENSIVE METABOLIC PANEL - Abnormal; Notable for the following components:      Result Value   Potassium 2.9 (*)    Glucose, Bld 160 (*)    Creatinine, Ser 1.30 (*)    Alkaline  Phosphatase 32 (*)    Total Bilirubin 1.4 (*)    All other components within normal limits  CBC - Abnormal; Notable for the following components:   WBC 16.5 (*)    RBC 4.08 (*)    Hemoglobin 11.9 (*)    HCT 33.8 (*)    Platelets 404 (*)    All other components within normal limits  URINALYSIS, COMPLETE (UACMP) WITH MICROSCOPIC - Abnormal; Notable for the following components:   Color, Urine YELLOW (*)    APPearance CLOUDY (*)    Hgb urine dipstick MODERATE (*)    Ketones, ur 5 (*)    All other components within normal limits  LIPASE, BLOOD   ____________________________________________  EKG  ED ECG REPORT I, Robert Kinner, the attending physician, personally viewed and interpreted this ECG.  Date: 01/21/2020  Rhythm: normal sinus rhythm QRS Axis: normal Intervals: normal ST/T Wave abnormalities: normal Narrative Interpretation: no evidence of acute ischemia ___________________________________________  RADIOLOGY  CT renal stone demonstrates ureterolithiasis ____________________________________________   PROCEDURES  Procedure(s) performed: No  Procedures   Critical Care performed: No ____________________________________________   INITIAL IMPRESSION / ASSESSMENT AND PLAN / ED COURSE  Pertinent labs & imaging results that were available during my care of the patient were reviewed by me and considered in my medical decision making (see chart for details).  Patient presents with right groin/right lower quadrant pain, some mild right flank pain as well.  Differential includes ureterolithiasis, appendicitis, constipation.  Suspicious for ureterolithiasis given hemoglobin and urinalysis.  Will treat with IV Toradol, IV Zofran, IV fluids, will obtain CT renal stone study.  Lab work notable for elevated white blood cell count and mild hypokalemia.  CT consistent with 1 to 2 mm stone in the right UVJ.  Urinalysis not consistent with infection.  Pain relief with Toradol.   Will discharge with analgesics, Zofran, Flomax, return precautions discussed    ____________________________________________   FINAL CLINICAL IMPRESSION(S) / ED DIAGNOSES  Final diagnoses:  Ureterolithiasis        Note:  This document was prepared using Dragon voice recognition software and may include unintentional dictation errors.   Kinner, Robert, MD 01/21/20 1544  

## 2020-01-21 NOTE — ED Triage Notes (Signed)
Pt in via EMS from home with c/o abd pain to RLQ for the past 2 days, some NV. Pt states thinks it is constipation but has lasted longer than normal. Pain started as a dull intermittent pain and is now constant. 8/10  164/92, 99%RA, 88HR, 97.1 Temp

## 2020-01-21 NOTE — Telephone Encounter (Signed)
[facility] Night - Client Nonclinical Telephone Record AccessNurse Client [facility] Night - Client Client Site [place] - Night Contact Type Call Who Is Calling Patient / Member / Family / Caregiver Caller Name [name] Phone Number [phone] Patient Name Joseph Lam Patient DOB 1966/06/04 Call Type Message Only Information Provided Reason for Call Request to Schedule Office Appointment Initial Comment Caller states he needs an appt. Additional Comment Disp. Time Disposition Final User 01/21/2020 8:06:01 AM General Information Provided Yes Scharlene Corn Call Closed By: Scharlene Corn Transaction Date/Time: 01/21/2020 8:05:05 AM (ET)

## 2020-02-03 ENCOUNTER — Encounter: Payer: Self-pay | Admitting: Internal Medicine

## 2020-02-19 ENCOUNTER — Telehealth: Payer: Self-pay | Admitting: Family Medicine

## 2020-02-19 DIAGNOSIS — E119 Type 2 diabetes mellitus without complications: Secondary | ICD-10-CM

## 2020-02-19 DIAGNOSIS — Z125 Encounter for screening for malignant neoplasm of prostate: Secondary | ICD-10-CM

## 2020-02-19 NOTE — Telephone Encounter (Signed)
-----   Message from Ellamae Sia sent at 02/04/2020  9:46 AM EDT ----- Regarding: Lab orders for Friday, 8.6.21 Patient is scheduled for CPX labs, please order future labs, Thanks , Karna Christmas

## 2020-02-20 ENCOUNTER — Other Ambulatory Visit (INDEPENDENT_AMBULATORY_CARE_PROVIDER_SITE_OTHER): Payer: BC Managed Care – PPO

## 2020-02-20 ENCOUNTER — Other Ambulatory Visit: Payer: Self-pay

## 2020-02-20 DIAGNOSIS — E119 Type 2 diabetes mellitus without complications: Secondary | ICD-10-CM | POA: Diagnosis not present

## 2020-02-20 DIAGNOSIS — Z125 Encounter for screening for malignant neoplasm of prostate: Secondary | ICD-10-CM

## 2020-02-20 LAB — LIPID PANEL
Cholesterol: 132 mg/dL (ref 0–200)
HDL: 55 mg/dL (ref >39.00)
LDL Cholesterol: 57 mg/dL (ref 0–99)
NonHDL: 76.52
Total CHOL/HDL Ratio: 2
Triglycerides: 96 mg/dL (ref 0.0–149.0)
VLDL: 19.2 mg/dL (ref 0.0–40.0)

## 2020-02-20 LAB — MICROALBUMIN / CREATININE URINE RATIO
Creatinine,U: 148.7 mg/dL
Microalb Creat Ratio: 0.7 mg/g (ref 0.0–30.0)
Microalb, Ur: 1.1 mg/dL (ref 0.0–1.9)

## 2020-02-20 LAB — COMPREHENSIVE METABOLIC PANEL
ALT: 11 U/L (ref 0–53)
AST: 14 U/L (ref 0–37)
Albumin: 4.5 g/dL (ref 3.5–5.2)
Alkaline Phosphatase: 30 U/L — ABNORMAL LOW (ref 39–117)
BUN: 15 mg/dL (ref 6–23)
CO2: 29 mEq/L (ref 19–32)
Calcium: 9.7 mg/dL (ref 8.4–10.5)
Chloride: 107 mEq/L (ref 96–112)
Creatinine, Ser: 0.94 mg/dL (ref 0.40–1.50)
GFR: 83.02 mL/min (ref >60.00)
Glucose, Bld: 103 mg/dL — ABNORMAL HIGH (ref 70–99)
Potassium: 3.9 mEq/L (ref 3.5–5.1)
Sodium: 141 mEq/L (ref 135–145)
Total Bilirubin: 0.5 mg/dL (ref 0.2–1.2)
Total Protein: 6.6 g/dL (ref 6.0–8.3)

## 2020-02-20 LAB — PSA: PSA: 1.83 ng/mL (ref 0.10–4.00)

## 2020-02-20 LAB — HEMOGLOBIN A1C: Hgb A1c MFr Bld: 6.1 % (ref 4.6–6.5)

## 2020-02-20 NOTE — Progress Notes (Signed)
No critical labs need to be addressed urgently. We will discuss labs in detail at upcoming office visit.   

## 2020-02-22 ENCOUNTER — Other Ambulatory Visit: Payer: Self-pay | Admitting: Family Medicine

## 2020-02-27 ENCOUNTER — Ambulatory Visit (INDEPENDENT_AMBULATORY_CARE_PROVIDER_SITE_OTHER): Payer: BC Managed Care – PPO | Admitting: Family Medicine

## 2020-02-27 ENCOUNTER — Encounter: Payer: Self-pay | Admitting: Family Medicine

## 2020-02-27 ENCOUNTER — Other Ambulatory Visit: Payer: Self-pay

## 2020-02-27 VITALS — BP 128/78 | HR 79 | Temp 98.2°F | Ht 68.5 in | Wt 155.0 lb

## 2020-02-27 DIAGNOSIS — E119 Type 2 diabetes mellitus without complications: Secondary | ICD-10-CM

## 2020-02-27 DIAGNOSIS — E1169 Type 2 diabetes mellitus with other specified complication: Secondary | ICD-10-CM | POA: Diagnosis not present

## 2020-02-27 DIAGNOSIS — Z0000 Encounter for general adult medical examination without abnormal findings: Secondary | ICD-10-CM

## 2020-02-27 DIAGNOSIS — E785 Hyperlipidemia, unspecified: Secondary | ICD-10-CM | POA: Diagnosis not present

## 2020-02-27 NOTE — Assessment & Plan Note (Signed)
Good control on januvia and metformin. Encouraged exercise, weight loss, healthy eating habits.

## 2020-02-27 NOTE — Assessment & Plan Note (Signed)
Well controlled. Continue current medication. ON statin.

## 2020-02-27 NOTE — Progress Notes (Signed)
Chief Complaint  Patient presents with  . Annual Exam    History of Present Illness: HPI The patient is here for annual wellness exam and preventative care.    S/P shoulder  RC repairt in rehab.  S/P knee cortisone injection  S./P kidney stone as well.   Diabetes:great control  On januvia and metformin. Off glipizide. Lab Results  Component Value Date   HGBA1C 6.1 02/20/2020  Using medications without difficulties: Hypoglycemic episodes:none Hyperglycemic episodes:none Feet problems: none Blood Sugars averaging:? eye exam within last year:yes   Elevated Cholesterol: LDL at goal on lipitor Lab Results  Component Value Date   CHOL 132 02/20/2020   HDL 55.00 02/20/2020   LDLCALC 57 02/20/2020   LDLDIRECT 96.0 12/27/2017   TRIG 96.0 02/20/2020   CHOLHDL 2 02/20/2020  Using medications without problems: Muscle aches:  Diet compliance: none Exercise: occ Other complaints: Body mass index is 23.22 kg/m.  Wt Readings from Last 3 Encounters:  02/27/20 155 lb (70.3 kg)  01/21/20 165 lb (74.8 kg)  10/20/19 161 lb 15.9 oz (73.5 kg)       This visit occurred during the SARS-CoV-2 public health emergency.  Safety protocols were in place, including screening questions prior to the visit, additional usage of staff PPE, and extensive cleaning of exam room while observing appropriate contact time as indicated for disinfecting solutions.   COVID 19 screen:  No recent travel or known exposure to COVID19 The patient denies respiratory symptoms of COVID 19 at this time. The importance of social distancing was discussed today.     Review of Systems  Constitutional: Negative for chills and fever.  HENT: Negative for congestion and ear pain.   Eyes: Negative for pain and redness.  Respiratory: Negative for cough and shortness of breath.   Cardiovascular: Negative for chest pain, palpitations and leg swelling.  Gastrointestinal: Negative for abdominal pain, blood in stool,  constipation, diarrhea, nausea and vomiting.  Genitourinary: Negative for dysuria.  Musculoskeletal: Negative for falls and myalgias.  Skin: Negative for rash.  Neurological: Negative for dizziness.  Psychiatric/Behavioral: Negative for depression. The patient is not nervous/anxious.       Past Medical History:  Diagnosis Date  . Arthritis    shoulder  . Diabetes mellitus ([place])   . GERD (gastroesophageal reflux disease)   . Hyperlipidemia   . PONV (postoperative nausea and vomiting)   . Substance abuse ([place])     reports that he has never smoked. He has never used smokeless tobacco. He reports previous alcohol use of about 1.0 standard drink of alcohol per week. He reports current drug use. Drug: Marijuana.   Current Outpatient Medications:  .  acetaminophen (TYLENOL) 500 MG tablet, Take 1,000 mg by mouth in the morning and at bedtime., Disp: , Rfl:  .  aspirin 81 MG tablet, Take 81 mg by mouth daily., Disp: , Rfl:  .  atorvastatin (LIPITOR) 40 MG tablet, TAKE 1 TABLET BY MOUTH EVERY DAY, Disp: 90 tablet, Rfl: 3 .  Blood Glucose Monitoring Suppl (ONE TOUCH ULTRA 2) w/Device KIT, CHECK BLOOD SUGAR DAILY, Disp: , Rfl: 0 .  Choline Fenofibrate (FENOFIBRIC ACID) 135 MG CPDR, Take 1 tablet by mouth daily. (Patient taking differently: Take 135 mg by mouth at bedtime. ), Disp: 90 capsule, Rfl: 3 .  ibuprofen (ADVIL) 200 MG tablet, Take 800 mg by mouth in the morning and at bedtime., Disp: , Rfl:  .  JANUVIA 100 MG tablet, TAKE 1 TABLET BY MOUTH EVERY DAY, Disp:  30 tablet, Rfl: 11 .  meloxicam (MOBIC) 7.5 MG tablet, Mobic 7.5 mg tablet  Take 1 tablet twice a day by oral route., Disp: , Rfl:  .  metFORMIN (GLUCOPHAGE) 500 MG tablet, Take 1 tablet (500 mg total) by mouth 2 (two) times daily with a meal. (Patient taking differently: Take 1,000 mg by mouth at bedtime. ), Disp: 180 tablet, Rfl: 1 .  metroNIDAZOLE (METROGEL) 1 % gel, Apply topically daily. (Patient taking differently: Apply 1  application topically daily as needed (Irritation). ), Disp: 45 g, Rfl: 0 .  MICROLET LANCETS MISC, Check blood sugar 1-2 times daily and as directed. 250.00, Disp: 100 each, Rfl: 11 .  Multiple Vitamins-Minerals (MULTIVITAMIN WITH MINERALS) tablet, Take 1 tablet by mouth at bedtime., Disp: , Rfl:  .  Omega-3 Fatty Acids (FISH OIL) 1000 MG CAPS, Take 1,000 mg by mouth at bedtime. , Disp: , Rfl:  .  Omeprazole Magnesium (PRILOSEC OTC PO), Take 20 mg by mouth at bedtime. , Disp: , Rfl:  .  ONETOUCH ULTRA test strip, CHECK BLOOD SUGAR DAILY, Disp: 100 each, Rfl: 3 .  traMADol (ULTRAM) 50 MG tablet, Take 50 mg by mouth every 6 (six) hours as needed., Disp: , Rfl:  .  traZODone (DESYREL) 50 MG tablet, TAKE 1/2 TO 1 TABLET (25-50 MG TOTAL) BY MOUTH AT BEDTIME AS NEEDED FOR SLEEP. (Patient taking differently: Take 25 mg by mouth at bedtime. ), Disp: 90 tablet, Rfl: 0   Observations/Objective: Blood pressure 128/78, pulse 79, temperature 98.2 F (36.8 C), temperature source Temporal, height 5' 8.5" (1.74 m), weight 155 lb (70.3 kg), SpO2 98 %.  Physical Exam Constitutional:      General: He is not in acute distress.    Appearance: Normal appearance. He is well-developed. He is not ill-appearing or toxic-appearing.  HENT:     Head: Normocephalic and atraumatic.     Right Ear: Hearing, ear canal and external ear normal. No tenderness. There is impacted cerumen. Tympanic membrane is not injected, erythematous or bulging.     Left Ear: Hearing, ear canal and external ear normal. No tenderness. There is impacted cerumen. Tympanic membrane is not injected, erythematous or bulging.     Ears:     Comments: BIlateral ear irrigation, no complications and     Nose: Nose normal.     Mouth/Throat:     Pharynx: Uvula midline.  Eyes:     General: Lids are normal. Lids are everted, no foreign bodies appreciated.     Conjunctiva/sclera: Conjunctivae normal.     Pupils: Pupils are equal, round, and reactive to  light.  Neck:     Thyroid: No thyroid mass or thyromegaly.     Vascular: No carotid bruit.     Trachea: Trachea and phonation normal.  Cardiovascular:     Rate and Rhythm: Normal rate and regular rhythm.     Pulses: Normal pulses.     Heart sounds: S1 normal and S2 normal. No murmur heard.  No gallop.   Pulmonary:     Breath sounds: Normal breath sounds. No wheezing, rhonchi or rales.  Abdominal:     General: Bowel sounds are normal.     Palpations: Abdomen is soft.     Tenderness: There is no abdominal tenderness. There is no guarding or rebound.     Hernia: No hernia is present.  Musculoskeletal:     Cervical back: Normal range of motion and neck supple.  Lymphadenopathy:     Cervical: No cervical  adenopathy.  Skin:    General: Skin is warm and dry.     Findings: No rash.  Neurological:     Mental Status: He is alert.     Cranial Nerves: No cranial nerve deficit.     Sensory: No sensory deficit.     Gait: Gait normal.     Deep Tendon Reflexes: Reflexes are normal and symmetric.  Psychiatric:        Speech: Speech normal.        Behavior: Behavior normal.        Judgment: Judgment normal.       Diabetic foot exam: Normal inspection No skin breakdown No calluses  Normal DP pulses Normal sensation to light touch and monofilament Nails normal  Assessment and Plan   The patient's preventative maintenance and recommended screening tests for an annual wellness exam were reviewed in full today. Brought up to date unless services declined.  Counselled on the importance of diet, exercise, and its role in overall health and mortality. The patient's FH and SH was reviewed, including their home life, tobacco status, and drug and alcohol status.   Prostate cancer In Dad At age 20.  Lab Results  Component Value Date   PSA 1.83 02/20/2020   PSA 1.65 08/07/2018   PSA 1.28 09/11/2017  Vaccines: Uptodate, COVID 19, PNA, Tdap Colon cancer screening: colonoscopy 2016  tubular adenoma, Dr. Hilarie Fredrickson, OVERDUE Nonsmoker  KKE:NIHNFQP. Hep C: done EYE exam:  Due   Hyperlipidemia associated with type 2 diabetes mellitus ([place]) Well controlled. Continue current medication. ON statin.  Type II diabetes mellitus, well controlled ([place]) Good control on januvia and metformin. Encouraged exercise, weight loss, healthy eating habits.     Eliezer Lofts, MD

## 2020-02-27 NOTE — Patient Instructions (Addendum)
Send in  yearly eye exam for diabetes: have the opthalmologist send [place] a copy of the evaluation for the chart.  Keep appt at Dr. Vena Rua office for colon cancer screening.

## 2020-03-02 ENCOUNTER — Other Ambulatory Visit: Payer: Self-pay | Admitting: *Deleted

## 2020-03-02 MED ORDER — METFORMIN HCL 500 MG PO TABS: 1000.0000 mg | ORAL_TABLET | Freq: Every day | ORAL | 1 refills | Status: DC

## 2020-03-24 ENCOUNTER — Other Ambulatory Visit: Payer: Self-pay

## 2020-03-24 ENCOUNTER — Ambulatory Visit
Admission: EM | Admit: 2020-03-24 | Discharge: 2020-03-24 | Disposition: A | Discharge status: Home / Self Care | Payer: BC Managed Care – PPO | Attending: Family Medicine | Admitting: Family Medicine

## 2020-03-24 DIAGNOSIS — L255 Unspecified contact dermatitis due to plants, except food: Secondary | ICD-10-CM

## 2020-03-24 DIAGNOSIS — R21 Rash and other nonspecific skin eruption: Secondary | ICD-10-CM

## 2020-03-24 MED ORDER — PREDNISONE 10 MG (21) PO TBPK: ORAL_TABLET | Freq: Every day | ORAL | 0 refills | Status: AC

## 2020-03-24 MED ORDER — TRIAMCINOLONE ACETONIDE 0.1 % EX CREA: 1.0000 "application " | TOPICAL_CREAM | Freq: Two times a day (BID) | CUTANEOUS | 0 refills | Status: DC

## 2020-03-24 MED ORDER — DEXAMETHASONE SODIUM PHOSPHATE 10 MG/ML IJ SOLN
10.0000 mg | Freq: Once | INTRAMUSCULAR | Status: AC
  Administered 2020-03-24: 10 mg via INTRAMUSCULAR

## 2020-03-24 NOTE — ED Provider Notes (Signed)
[place]   [id number] 03/24/20 Arrival Time: 4010  CC: RASH  SUBJECTIVE:  Joseph Lam is a 54 y.o. male who presents with a skin complaint that began 3 days ago.  Reports that he got into some poison ivy.  Reports that he has the rash to bilateral arms and legs as well as the left side of his neck.  Reports that every time that he gets poison ivy, he cannot control his scratching and that it ends up on his face and his eyes.  Reports that he has used over-the-counter antiitch cream with no relief. Denies changes in soaps, detergents, close contacts with similar rash, known trigger or environmental trigger, allergy. There are no aggravating or alleviating factors. Denies fever, chills, nausea, vomiting, erythema, swelling, discharge, oral lesions, SOB, chest pain, abdominal pain, changes in bowel or bladder function.    ROS: As per HPI.  All other pertinent ROS negative.     Past Medical History:  Diagnosis Date  . Arthritis    shoulder  . Diabetes mellitus ([place])   . GERD (gastroesophageal reflux disease)   . Hyperlipidemia   . PONV (postoperative nausea and vomiting)   . Substance abuse [facility])    Past Surgical History:  Procedure Laterality Date  . BICEPT TENODESIS Right 10/23/2019   Procedure: BICEPS TENODESIS;  Surgeon: Thornton Park, MD;  Location: [facility] ORS;  Service: Orthopedics;  Laterality: Right;  . HERNIA REPAIR     umbilical repair  . LABRAL REPAIR Right 10/23/2019   Procedure: LABRAL REPAIR OPEN;  Surgeon: Thornton Park, MD;  Location: [facility] ORS;  Service: Orthopedics;  Laterality: Right;  . SHOULDER ARTHROSCOPY WITH OPEN ROTATOR CUFF REPAIR Right 10/23/2019   Procedure: SHOULDER ARTHROSCOPY WITH OPEN ROTATOR CUFF REPAIR;  Surgeon: Thornton Park, MD;  Location: [facility] ORS;  Service: Orthopedics;  Laterality: Right;  . SUBACROMIAL DECOMPRESSION Right 10/23/2019   Procedure: SUBACROMIAL DECOMPRESSION;  Surgeon: Thornton Park, MD;  Location: [facility] ORS;   Service: Orthopedics;  Laterality: Right;  . TONSILLECTOMY     as a child   No Known Allergies No current facility-administered medications on file prior to encounter.   Current Outpatient Medications on File Prior to Encounter  Medication Sig Dispense Refill  . acetaminophen (TYLENOL) 500 MG tablet Take 1,000 mg by mouth in the morning and at bedtime.    Marland Kitchen aspirin 81 MG tablet Take 81 mg by mouth daily.    Marland Kitchen atorvastatin (LIPITOR) 40 MG tablet TAKE 1 TABLET BY MOUTH EVERY DAY 90 tablet 3  . Blood Glucose Monitoring Suppl (ONE TOUCH ULTRA 2) w/Device KIT CHECK BLOOD SUGAR DAILY  0  . Choline Fenofibrate (FENOFIBRIC ACID) 135 MG CPDR Take 1 tablet by mouth daily. (Patient taking differently: Take 135 mg by mouth at bedtime. ) 90 capsule 3  . ibuprofen (ADVIL) 200 MG tablet Take 800 mg by mouth in the morning and at bedtime.    Marland Kitchen JANUVIA 100 MG tablet TAKE 1 TABLET BY MOUTH EVERY DAY 30 tablet 11  . meloxicam (MOBIC) 7.5 MG tablet Mobic 7.5 mg tablet  Take 1 tablet twice a day by oral route.    . metFORMIN (GLUCOPHAGE) 500 MG tablet Take 2 tablets (1,000 mg total) by mouth at bedtime. 180 tablet 1  . metroNIDAZOLE (METROGEL) 1 % gel Apply topically daily. (Patient taking differently: Apply 1 application topically daily as needed (Irritation). ) 45 g 0  . MICROLET LANCETS MISC Check blood sugar 1-2 times daily and as directed. 250.00 100  each 11  . Multiple Vitamins-Minerals (MULTIVITAMIN WITH MINERALS) tablet Take 1 tablet by mouth at bedtime.    . Omega-3 Fatty Acids (FISH OIL) 1000 MG CAPS Take 1,000 mg by mouth at bedtime.     . Omeprazole Magnesium (PRILOSEC OTC PO) Take 20 mg by mouth at bedtime.     Glory Rosebush ULTRA test strip CHECK BLOOD SUGAR DAILY 100 each 3  . traMADol (ULTRAM) 50 MG tablet Take 50 mg by mouth every 6 (six) hours as needed.    . traZODone (DESYREL) 50 MG tablet TAKE 1/2 TO 1 TABLET (25-50 MG TOTAL) BY MOUTH AT BEDTIME AS NEEDED FOR SLEEP. (Patient taking  differently: Take 25 mg by mouth at bedtime. ) 90 tablet 0   Social History   Socioeconomic History  . Marital status: Married    Spouse name: tamala  . Number of children: 3  . Years of education: Not on file  . Highest education level: Not on file  Occupational History  . Not on file  Tobacco Use  . Smoking status: Never Smoker  . Smokeless tobacco: Never Used  Vaping Use  . Vaping Use: Never used  Substance and Sexual Activity  . Alcohol use: Not Currently    Alcohol/week: 1.0 standard drink    Types: 1 Cans of beer per week    Comment: 4-6 per month  . Drug use: Yes    Types: Marijuana  . Sexual activity: Not on file  Other Topics Concern  . Not on file  Social History Narrative   Married, 3 kids   Social Determinants of Health   Financial Resource Strain:   . Difficulty of Paying Living Expenses: Not on file  Food Insecurity:   . Worried About Charity fundraiser in the Last Year: Not on file  . Ran Out of Food in the Last Year: Not on file  Transportation Needs:   . Lack of Transportation (Medical): Not on file  . Lack of Transportation (Non-Medical): Not on file  Physical Activity:   . Days of Exercise per Week: Not on file  . Minutes of Exercise per Session: Not on file  Stress:   . Feeling of Stress : Not on file  Social Connections:   . Frequency of Communication with Friends and Family: Not on file  . Frequency of Social Gatherings with Friends and Family: Not on file  . Attends Religious Services: Not on file  . Active Member of Clubs or Organizations: Not on file  . Attends Archivist Meetings: Not on file  . Marital Status: Not on file  Intimate Partner Violence:   . Fear of Current or Ex-Partner: Not on file  . Emotionally Abused: Not on file  . Physically Abused: Not on file  . Sexually Abused: Not on file   Family History  Problem Relation Age of Onset  . Cancer Father 56       pancreatic cancer  . Depression Sister   .  Depression Sister   . Colon cancer Neg Hx     OBJECTIVE: Vitals:   03/24/20 1604 03/24/20 1606  BP:  (!) 151/82  Pulse:  74  Resp:  18  Temp:  98.7 F (37.1 C)  TempSrc:  Oral  SpO2:  99%  Weight: 160 lb (72.6 kg)     General appearance: alert; no distress Head: NCAT Lungs: clear to auscultation bilaterally Heart: regular rate and rhythm.  Radial pulse 2+ bilaterally Extremities: no edema Skin: warm  and dry; erythematous, vesicular rash in patches to bilateral arms, bilateral legs and small patch to the left neck Psychological: alert and cooperative; normal mood and affect  ASSESSMENT & PLAN:  1. Contact dermatitis due to plants, except food, unspecified contact dermatitis type   2. Rash and nonspecific skin eruption     Meds ordered this encounter  Medications  . dexamethasone (DECADRON) injection 10 mg  . predniSONE (STERAPRED UNI-PAK 21 TAB) 10 MG (21) TBPK tablet    Sig: Take by mouth daily for 6 days. Take 6 tablets on day 1, 5 tablets on day 2, 4 tablets on day 3, 3 tablets on day 4, 2 tablets on day 5, 1 tablet on day 6    Dispense:  21 tablet    Refill:  0    Order Specific Question:   Supervising Provider    Answer:   Chase Picket [id number]  . triamcinolone cream (KENALOG) 0.1 %    Sig: Apply 1 application topically 2 (two) times daily.    Dispense:  30 g    Refill:  0    Order Specific Question:   Supervising Provider    Answer:   Chase Picket [[id number]]   Decadron 10 mg IM in office today Prednisone taper prescribed Triamcinolone 0.1% (corticosteroid - itch/ inflammation relief) Take as prescribed and to completion Avoid hot showers/ baths Moisturize skin daily  Follow up with PCP if symptoms persists Return or go to the ER if you have any new or worsening symptoms such as fever, chills, nausea, vomiting, redness, swelling, discharge, if symptoms do not improve with medications  Reviewed expectations re: course of current medical issues.  Questions answered. Outlined signs and symptoms indicating need for more acute intervention. Patient verbalized understanding. After Visit Summary given.   Faustino Congress, NP 03/24/20 1631

## 2020-03-24 NOTE — ED Triage Notes (Signed)
Pt is here with poison ivy that started 3 days ago, pt states it has spread all over his body. Pt has not taken anything to relieve discomfort.

## 2020-03-24 NOTE — Discharge Instructions (Addendum)
You have contact dermatitis.   This is a rash that appears when your skin is exposed to an irritant.   You have received a steroid injection in the office today  I have sent in a prednisone taper for you to take for 6 days. 6 tablets on day one, 5 tablets on day two, 4 tablets on day three, 3 tablets on day four, 2 tablets on day five, and 1 tablet on day six.  Use the steroid cream no more than three times per day.  Follow up with your primary care provider, or to see [place] as needed.  Report to the emergency room for shortness of breath, high fever, severe diarrhea, or other concerning symptoms.

## 2020-03-30 ENCOUNTER — Encounter: Payer: Self-pay | Admitting: Internal Medicine

## 2020-03-30 ENCOUNTER — Other Ambulatory Visit: Payer: Self-pay

## 2020-03-30 ENCOUNTER — Ambulatory Visit (AMBULATORY_SURGERY_CENTER): Payer: Self-pay | Admitting: *Deleted

## 2020-03-30 VITALS — Ht 68.5 in | Wt 161.0 lb

## 2020-03-30 DIAGNOSIS — Z86010 Personal history of colonic polyps: Secondary | ICD-10-CM

## 2020-03-30 MED ORDER — SUTAB 1479-225-188 MG PO TABS: 24.0000 | ORAL_TABLET | ORAL | 0 refills | Status: DC

## 2020-03-30 NOTE — Progress Notes (Signed)
cov vax x 2  No egg or soy allergy known to patient  issues with past sedation with any surgeries or procedures of PONV  no intubation problems in the past  No FH of Malignant Hyperthermia No diet pills per patient No home 02 use per patient  No blood thinners per patient  Pt denies issues with constipation  No A fib or A flutter  EMMI video to pt or via [place] 19 guidelines implemented in PV today with Pt and RN   [employer] given to pt in PV today , Code to Pharmacy   Due to the COVID-19 pandemic we are asking patients to follow these guidelines. Please only bring one care partner. Please be aware that your care partner may wait in the car in the parking lot or if they feel like they will be too hot to wait in the car, they may wait in the lobby on the 4th floor. All care partners are required to wear a mask the entire time (we do not have any that we can provide them), they need to practice social distancing, and we will do a Covid check for all patient's and care partners when you arrive. Also we will check their temperature and your temperature. If the care partner waits in their car they need to stay in the parking lot the entire time and we will call them on their cell phone when the patient is ready for discharge so they can bring the car to the front of the building. Also all patient's will need to wear a mask into building.

## 2020-04-08 ENCOUNTER — Ambulatory Visit
Admission: RE | Admit: 2020-04-08 | Discharge: 2020-04-08 | Disposition: A | Discharge status: Home / Self Care | Payer: BC Managed Care – PPO | Source: Ambulatory Visit | Attending: Family Medicine | Admitting: Family Medicine

## 2020-04-08 ENCOUNTER — Other Ambulatory Visit: Payer: Self-pay

## 2020-04-08 VITALS — BP 151/84 | HR 77 | Temp 98.2°F | Resp 14

## 2020-04-08 DIAGNOSIS — R21 Rash and other nonspecific skin eruption: Secondary | ICD-10-CM | POA: Diagnosis not present

## 2020-04-08 DIAGNOSIS — L255 Unspecified contact dermatitis due to plants, except food: Secondary | ICD-10-CM | POA: Diagnosis not present

## 2020-04-08 MED ORDER — DEXAMETHASONE SODIUM PHOSPHATE 10 MG/ML IJ SOLN
10.0000 mg | Freq: Once | INTRAMUSCULAR | Status: AC
  Administered 2020-04-08: 10 mg via INTRAMUSCULAR

## 2020-04-08 MED ORDER — PREDNISONE 10 MG (21) PO TBPK: ORAL_TABLET | Freq: Every day | ORAL | 0 refills | Status: AC

## 2020-04-08 NOTE — ED Triage Notes (Signed)
Patient here with poison ivy on bilateral ankles, forearms, lower abdomen, left shoulder, and right eyelid x1 week.

## 2020-04-08 NOTE — Discharge Instructions (Addendum)
You have received a steroid injection in the office today to help the rash  I have sent in a prednisone taper for you to take for 6 days. 6 tablets on day one, 5 tablets on day two, 4 tablets on day three, 3 tablets on day four, 2 tablets on day five, and 1 tablet on day six.  You may take the steroids if the injection is not slowing the itching after today  Follow up with this office or with primary care as  needed

## 2020-04-08 NOTE — ED Provider Notes (Signed)
[place]   [id number] 04/08/20 Arrival Time: 1093  CC: RASH  SUBJECTIVE:  Joseph Lam is a 54 y.o. male who presents with a skin complaint that began about a week ago. Reports that he was in poison ivy. Reports tha he has been using triamcinolone cream with some relief. Is concerned today because the rash has appeared on his right eyelid. Denies fever, chills, nausea, vomiting, swelling, discharge, oral lesions, SOB, chest pain, abdominal pain, changes in bowel or bladder function.    ROS: As per HPI.  All other pertinent ROS negative.     Past Medical History:  Diagnosis Date  . Allergy   . Arthritis    shoulder  . Diabetes mellitus ([place])   . GERD (gastroesophageal reflux disease)   . Hyperlipidemia   . PONV (postoperative nausea and vomiting)   . Substance abuse [facility])    Past Surgical History:  Procedure Laterality Date  . BICEPT TENODESIS Right 10/23/2019   Procedure: BICEPS TENODESIS;  Surgeon: Thornton Park, MD;  Location: [facility] ORS;  Service: Orthopedics;  Laterality: Right;  . COLONOSCOPY    . HERNIA REPAIR     umbilical repair  . LABRAL REPAIR Right 10/23/2019   Procedure: LABRAL REPAIR OPEN;  Surgeon: Thornton Park, MD;  Location: [facility] ORS;  Service: Orthopedics;  Laterality: Right;  . POLYPECTOMY    . SHOULDER ARTHROSCOPY WITH OPEN ROTATOR CUFF REPAIR Right 10/23/2019   Procedure: SHOULDER ARTHROSCOPY WITH OPEN ROTATOR CUFF REPAIR;  Surgeon: Thornton Park, MD;  Location: [facility] ORS;  Service: Orthopedics;  Laterality: Right;  . SUBACROMIAL DECOMPRESSION Right 10/23/2019   Procedure: SUBACROMIAL DECOMPRESSION;  Surgeon: Thornton Park, MD;  Location: [facility] ORS;  Service: Orthopedics;  Laterality: Right;  . TONSILLECTOMY     as a child   No Known Allergies No current facility-administered medications on file prior to encounter.   Current Outpatient Medications on File Prior to Encounter  Medication Sig Dispense Refill  . aspirin 81 MG tablet Take  81 mg by mouth daily.    Marland Kitchen atorvastatin (LIPITOR) 40 MG tablet TAKE 1 TABLET BY MOUTH EVERY DAY 90 tablet 3  . Choline Fenofibrate (FENOFIBRIC ACID) 135 MG CPDR Take 1 tablet by mouth daily. (Patient taking differently: Take 135 mg by mouth at bedtime. ) 90 capsule 3  . metFORMIN (GLUCOPHAGE) 500 MG tablet Take 2 tablets (1,000 mg total) by mouth at bedtime. 180 tablet 1  . Multiple Vitamins-Minerals (MULTIVITAMIN WITH MINERALS) tablet Take 1 tablet by mouth at bedtime.    . Omega-3 Fatty Acids (FISH OIL) 1000 MG CAPS Take 1,000 mg by mouth at bedtime.     . Omeprazole Magnesium (PRILOSEC OTC PO) Take 20 mg by mouth at bedtime.     Marland Kitchen acetaminophen (TYLENOL) 500 MG tablet Take 1,000 mg by mouth in the morning and at bedtime.    . Blood Glucose Monitoring Suppl (ONE TOUCH ULTRA 2) w/Device KIT CHECK BLOOD SUGAR DAILY  0  . ibuprofen (ADVIL) 200 MG tablet Take 800 mg by mouth in the morning and at bedtime.    Marland Kitchen JANUVIA 100 MG tablet TAKE 1 TABLET BY MOUTH EVERY DAY 30 tablet 11  . meloxicam (MOBIC) 7.5 MG tablet Mobic 7.5 mg tablet  Take 1 tablet twice a day by oral route.    . metroNIDAZOLE (METROGEL) 1 % gel Apply topically daily. (Patient taking differently: Apply 1 application topically daily as needed (Irritation). ) 45 g 0  . MICROLET LANCETS MISC Check blood sugar 1-2  times daily and as directed. 250.00 100 each 11  . ONETOUCH ULTRA test strip CHECK BLOOD SUGAR DAILY 100 each 3  . Sodium Sulfate-Mag Sulfate-KCl (SUTAB) [phone] MG TABS Take 24 tablets by mouth as directed. MANUFACTURER CODES!! BIN: [id number] PCN: CN GROUP: [id number] MEMBER ID: [id number];SEG AS CASH;NO PRIOR AUTHORIZATION 24 tablet 0  . traMADol (ULTRAM) 50 MG tablet Take 50 mg by mouth every 6 (six) hours as needed. (Patient not taking: Reported on 03/30/2020)    . traZODone (DESYREL) 50 MG tablet TAKE 1/2 TO 1 TABLET (25-50 MG TOTAL) BY MOUTH AT BEDTIME AS NEEDED FOR SLEEP. (Patient taking differently: Take 25 mg by mouth at  bedtime. ) 90 tablet 0  . triamcinolone cream (KENALOG) 0.1 % Apply 1 application topically 2 (two) times daily. 30 g 0   Social History   Socioeconomic History  . Marital status: Married    Spouse name: tamala  . Number of children: 3  . Years of education: Not on file  . Highest education level: Not on file  Occupational History  . Not on file  Tobacco Use  . Smoking status: Never Smoker  . Smokeless tobacco: Never Used  Vaping Use  . Vaping Use: Never used  Substance and Sexual Activity  . Alcohol use: Not Currently    Alcohol/week: 1.0 standard drink    Types: 1 Cans of beer per week    Comment: 4-6 per month  . Drug use: Yes    Types: Marijuana  . Sexual activity: Not on file  Other Topics Concern  . Not on file  Social History Narrative   Married, 3 kids   Social Determinants of Health   Financial Resource Strain:   . Difficulty of Paying Living Expenses: Not on file  Food Insecurity:   . Worried About Charity fundraiser in the Last Year: Not on file  . Ran Out of Food in the Last Year: Not on file  Transportation Needs:   . Lack of Transportation (Medical): Not on file  . Lack of Transportation (Non-Medical): Not on file  Physical Activity:   . Days of Exercise per Week: Not on file  . Minutes of Exercise per Session: Not on file  Stress:   . Feeling of Stress : Not on file  Social Connections:   . Frequency of Communication with Friends and Family: Not on file  . Frequency of Social Gatherings with Friends and Family: Not on file  . Attends Religious Services: Not on file  . Active Member of Clubs or Organizations: Not on file  . Attends Archivist Meetings: Not on file  . Marital Status: Not on file  Intimate Partner Violence:   . Fear of Current or Ex-Partner: Not on file  . Emotionally Abused: Not on file  . Physically Abused: Not on file  . Sexually Abused: Not on file   Family History  Problem Relation Age of Onset  . Cancer Father  103       pancreatic cancer  . Pancreatic cancer Father   . Prostate cancer Father   . Depression Sister   . Depression Sister   . Colon cancer Neg Hx   . Colon polyps Neg Hx   . Esophageal cancer Neg Hx   . Rectal cancer Neg Hx   . Stomach cancer Neg Hx     OBJECTIVE: Vitals:   04/08/20 0904  BP: (!) 151/84  Pulse: 77  Resp: 14  Temp: 98.2 F (36.8 C)  SpO2: 97%    General appearance: alert; no distress Head: NCAT Lungs: clear to auscultation bilaterally Heart: regular rate and rhythm.  Radial pulse 2+ bilaterally Extremities: no edema Skin: warm and dry; erythematous vesicular rash to right eyelid Psychological: alert and cooperative; normal mood and affect  ASSESSMENT & PLAN:  1. Contact dermatitis due to plants, except food, unspecified contact dermatitis type   2. Rash and nonspecific skin eruption     Meds ordered this encounter  Medications  . dexamethasone (DECADRON) injection 10 mg  . predniSONE (STERAPRED UNI-PAK 21 TAB) 10 MG (21) TBPK tablet    Sig: Take by mouth daily for 6 days. Take 6 tablets on day 1, 5 tablets on day 2, 4 tablets on day 3, 3 tablets on day 4, 2 tablets on day 5, 1 tablet on day 6    Dispense:  21 tablet    Refill:  0    Order Specific Question:   Supervising Provider    Answer:   Chase Picket [[id number]]   Decadron 44m IM in office today Steroid taper prescribed Take as prescribed and to completion Avoid hot showers/ baths Moisturize skin daily  Follow up with PCP if symptoms persists Return or go to the ER if you have any new or worsening symptoms such as fever, chills, nausea, vomiting, redness, swelling, discharge, if symptoms do not improve with medications  Reviewed expectations re: course of current medical issues. Questions answered. Outlined signs and symptoms indicating need for more acute intervention. Patient verbalized understanding. After Visit Summary given.   MFaustino Congress NP 04/08/20 [phone]

## 2020-04-09 ENCOUNTER — Telehealth: Payer: Self-pay | Admitting: Internal Medicine

## 2020-04-09 NOTE — Telephone Encounter (Signed)
Left message for pt that it is ok to proceed with procedure as scheduled.

## 2020-04-09 NOTE — Telephone Encounter (Signed)
Pt got poison ivy and was prescribed steroid and prednisone for 6 days beginning today. He was also given dexamethasone injection at the urgent care yesterday. He is scheduled for a colonoscopy next Tuesday 9/28. He wants to know if he can still proceed with procedure or if he needs to hold on medications until after procedure. Pls call him.

## 2020-04-12 ENCOUNTER — Encounter: Payer: Self-pay | Admitting: Certified Registered Nurse Anesthetist

## 2020-04-13 ENCOUNTER — Encounter: Payer: Self-pay | Admitting: Internal Medicine

## 2020-04-13 ENCOUNTER — Ambulatory Visit (AMBULATORY_SURGERY_CENTER): Payer: BC Managed Care – PPO | Admitting: Internal Medicine

## 2020-04-13 ENCOUNTER — Other Ambulatory Visit: Payer: Self-pay

## 2020-04-13 VITALS — BP 152/92 | HR 64 | Temp 97.8°F | Resp 8 | Ht 68.5 in | Wt 161.0 lb

## 2020-04-13 DIAGNOSIS — Z86010 Personal history of colonic polyps: Secondary | ICD-10-CM

## 2020-04-13 MED ORDER — SODIUM CHLORIDE 0.9 % IV SOLN: 500.0000 mL | Freq: Once | INTRAVENOUS | Status: DC

## 2020-04-13 NOTE — Progress Notes (Signed)
Report given to PACU, vss 

## 2020-04-13 NOTE — Progress Notes (Signed)
Pt's states no medical or surgical changes since previsit or office visit.  Patient began a course of prednisone on September 23.  Vs - SF

## 2020-04-13 NOTE — Patient Instructions (Addendum)
Handouts were given to you on diverticulosis and hemorrhoids. Your blood sugar was 121 in the recovery room. You may resume your current medications today. Repeat colonoscopy for surveillance in 5 years. Please call if any questions or concerns.    YOU HAD AN ENDOSCOPIC PROCEDURE TODAY AT [facility]:   Refer to the procedure report that was given to you for any specific questions about what was found during the examination.  If the procedure report does not answer your questions, please call your gastroenterologist to clarify.  If you requested that your care partner not be given the details of your procedure findings, then the procedure report has been included in a sealed envelope for you to review at your convenience later.  YOU SHOULD EXPECT: Some feelings of bloating in the abdomen. Passage of more gas than usual.  Walking can help get rid of the air that was put into your GI tract during the procedure and reduce the bloating. If you had a lower endoscopy (such as a colonoscopy or flexible sigmoidoscopy) you may notice spotting of blood in your stool or on the toilet paper. If you underwent a bowel prep for your procedure, you may not have a normal bowel movement for a few days.  Please Note:  You might notice some irritation and congestion in your nose or some drainage.  This is from the oxygen used during your procedure.  There is no need for concern and it should clear up in a day or so.  SYMPTOMS TO REPORT IMMEDIATELY:   Following lower endoscopy (colonoscopy or flexible sigmoidoscopy):  Excessive amounts of blood in the stool  Significant tenderness or worsening of abdominal pains  Swelling of the abdomen that is new, acute  Fever of 100F or higher  For urgent or emergent issues, a gastroenterologist can be reached at any hour by calling [phone]. Do not use MyChart messaging for urgent concerns.    DIET:  We do recommend a small meal at first, but then  you may proceed to your regular diet.  Drink plenty of fluids but you should avoid alcoholic beverages for 24 hours.  ACTIVITY:  You should plan to take it easy for the rest of today and you should NOT DRIVE or use heavy machinery until tomorrow (because of the sedation medicines used during the test).    FOLLOW UP: Our staff will call the number listed on your records 48-72 hours following your procedure to check on you and address any questions or concerns that you may have regarding the information given to you following your procedure. If we do not reach you, we will leave a message.  We will attempt to reach you two times.  During this call, we will ask if you have developed any symptoms of COVID 19. If you develop any symptoms (ie: fever, flu-like symptoms, shortness of breath, cough etc.) before then, please call [phone].  If you test positive for Covid 19 in the 2 weeks post procedure, please call and report this information to [place].    If any biopsies were taken you will be contacted by phone or by letter within the next 1-3 weeks.  Please call us at [phone] if you have not heard about the biopsies in 3 weeks.    SIGNATURES/CONFIDENTIALITY: You and/or your care partner have signed paperwork which will be entered into your electronic medical record.  These signatures attest to the fact that that the information above on your After Visit  Summary has been reviewed and is understood.  Full responsibility of the confidentiality of this discharge information lies with you and/or your care-partner. 

## 2020-04-13 NOTE — Progress Notes (Signed)
Pt requested to void.  I explained he could not get out of the bed until he had been on the monitor for  20 minutes and his VS were stable.  Pt tried to void with a urinal in the bed unsuccessfully. Pt was awake and I held his arm, I allowed him to stand beside the bed and void after 15 minutes.  He voided 650 ml clear straw colored urin into the urinal.   No problems noted in the recovery room. maw

## 2020-04-13 NOTE — Op Note (Signed)
[place] Patient Name: Joseph Lam Procedure Date: 04/13/2020 12:00 PM MRN: [id number] Endoscopist: Jerene Bears , MD Age: 54 Referring MD:  Date of Birth: 01/01/1966 Gender: Male Account #: [id number] Procedure:                Colonoscopy Indications:              High risk colon cancer surveillance: Personal                            history of adenoma (10 mm or greater in size), Last                            colonoscopy: March 2016 Medicines:                Monitored Anesthesia Care Procedure:                Pre-Anesthesia Assessment:                           - Prior to the procedure, a History and Physical                            was performed, and patient medications and                            allergies were reviewed. The patient's tolerance of                            previous anesthesia was also reviewed. The risks                            and benefits of the procedure and the sedation                            options and risks were discussed with the patient.                            All questions were answered, and informed consent                            was obtained. Prior Anticoagulants: The patient has                            taken no previous anticoagulant or antiplatelet                            agents. ASA Grade Assessment: II - A patient with                            mild systemic disease. After reviewing the risks                            and benefits, the patient was deemed in  satisfactory condition to undergo the procedure.                           After obtaining informed consent, the colonoscope                            was passed under direct vision. Throughout the                            procedure, the patient's blood pressure, pulse, and                            oxygen saturations were monitored continuously. The                            Colonoscope was introduced through the anus  and                            advanced to the cecum, identified by appendiceal                            orifice and ileocecal valve. The colonoscopy was                            performed without difficulty. The patient tolerated                            the procedure well. The quality of the bowel                            preparation was good. The ileocecal valve,                            appendiceal orifice, and rectum were photographed. Scope In: 12:12:06 PM Scope Out: 12:25:31 PM Scope Withdrawal Time: 0 hours 11 minutes 22 seconds  Total Procedure Duration: 0 hours 13 minutes 25 seconds  Findings:                 The digital rectal exam was normal.                           Multiple small and large-mouthed diverticula were                            found in the sigmoid colon, descending colon and                            transverse colon.                           Internal hemorrhoids were found during                            retroflexion. The hemorrhoids were medium-sized.  The exam was otherwise without abnormality. Complications:            No immediate complications. Estimated Blood Loss:     Estimated blood loss: none. Impression:               - Severe diverticulosis in the sigmoid colon, in                            the descending colon and mild in the transverse                            colon.                           - Internal hemorrhoids.                           - The examination was otherwise normal.                           - No specimens collected. Recommendation:           - Patient has a contact number available for                            emergencies. The signs and symptoms of potential                            delayed complications were discussed with the                            patient. Return to normal activities tomorrow.                            Written discharge instructions were provided to the                             patient.                           - Resume previous diet.                           - Continue present medications.                           - Repeat colonoscopy in 5 years for surveillance. Jerene Bears, MD 04/13/2020 53:29:92 PM This report has been signed electronically.

## 2020-04-15 ENCOUNTER — Telehealth: Payer: Self-pay | Admitting: *Deleted

## 2020-04-15 NOTE — Telephone Encounter (Signed)
°  Follow up Call-  Call back number 04/13/2020  Post procedure Call Back phone  # [phone]  Permission to leave phone message Yes  Some recent data might be hidden     Patient questions:  Message left to call us if necessary. Second call.

## 2020-05-11 ENCOUNTER — Other Ambulatory Visit: Payer: Self-pay | Admitting: Family Medicine

## 2020-06-02 LAB — HM DIABETES EYE EXAM

## 2020-08-23 ENCOUNTER — Other Ambulatory Visit: Payer: Self-pay | Admitting: Family Medicine

## 2020-08-23 LAB — HM DIABETES FOOT EXAM

## 2020-08-23 NOTE — Telephone Encounter (Signed)
Please schedule Joseph Lam for diabetes follow up with labs prior.

## 2020-08-23 NOTE — Telephone Encounter (Signed)
Spoke with patient scheduled follow up and lab

## 2020-08-26 ENCOUNTER — Telehealth: Payer: Self-pay | Admitting: Family Medicine

## 2020-08-26 DIAGNOSIS — E119 Type 2 diabetes mellitus without complications: Secondary | ICD-10-CM

## 2020-08-26 NOTE — Telephone Encounter (Signed)
-----   Message from Cloyd Stagers, RT sent at 08/23/2020  1:09 PM EST ----- Regarding: Lab Orders for Friday 2.11.2022 Please place lab orders for Friday 2.11.2022, office visit for DM f/u on Tuesday 2.15.2022 Thank you, Dyke Maes RT(R)

## 2020-08-27 ENCOUNTER — Other Ambulatory Visit: Payer: Self-pay

## 2020-08-27 ENCOUNTER — Other Ambulatory Visit (INDEPENDENT_AMBULATORY_CARE_PROVIDER_SITE_OTHER): Payer: BC Managed Care – PPO

## 2020-08-27 DIAGNOSIS — E119 Type 2 diabetes mellitus without complications: Secondary | ICD-10-CM

## 2020-08-27 LAB — LIPID PANEL
Cholesterol: 117 mg/dL (ref 0–200)
HDL: 48.4 mg/dL (ref >39.00)
LDL Cholesterol: 56 mg/dL (ref 0–99)
NonHDL: 68.3
Total CHOL/HDL Ratio: 2
Triglycerides: 61 mg/dL (ref 0.0–149.0)
VLDL: 12.2 mg/dL (ref 0.0–40.0)

## 2020-08-27 LAB — COMPREHENSIVE METABOLIC PANEL
ALT: 13 U/L (ref 0–53)
AST: 19 U/L (ref 0–37)
Albumin: 4.6 g/dL (ref 3.5–5.2)
Alkaline Phosphatase: 29 U/L — ABNORMAL LOW (ref 39–117)
BUN: 15 mg/dL (ref 6–23)
CO2: 30 mEq/L (ref 19–32)
Calcium: 9.4 mg/dL (ref 8.4–10.5)
Chloride: 105 mEq/L (ref 96–112)
Creatinine, Ser: 0.98 mg/dL (ref 0.40–1.50)
GFR: 86.2 mL/min (ref >60.00)
Glucose, Bld: 100 mg/dL — ABNORMAL HIGH (ref 70–99)
Potassium: 3.7 mEq/L (ref 3.5–5.1)
Sodium: 141 mEq/L (ref 135–145)
Total Bilirubin: 0.6 mg/dL (ref 0.2–1.2)
Total Protein: 7.1 g/dL (ref 6.0–8.3)

## 2020-08-30 LAB — HEMOGLOBIN A1C: Hgb A1c MFr Bld: 6.1 % (ref 4.6–6.5)

## 2020-08-31 ENCOUNTER — Encounter: Payer: Self-pay | Admitting: Family Medicine

## 2020-08-31 ENCOUNTER — Ambulatory Visit (INDEPENDENT_AMBULATORY_CARE_PROVIDER_SITE_OTHER): Payer: BC Managed Care – PPO | Admitting: Family Medicine

## 2020-08-31 ENCOUNTER — Other Ambulatory Visit: Payer: Self-pay

## 2020-08-31 VITALS — BP 142/70 | HR 85 | Temp 98.5°F | Ht 68.5 in | Wt 158.2 lb

## 2020-08-31 DIAGNOSIS — F4321 Adjustment disorder with depressed mood: Secondary | ICD-10-CM

## 2020-08-31 DIAGNOSIS — E119 Type 2 diabetes mellitus without complications: Secondary | ICD-10-CM

## 2020-08-31 DIAGNOSIS — Z23 Encounter for immunization: Secondary | ICD-10-CM

## 2020-08-31 DIAGNOSIS — F5104 Psychophysiologic insomnia: Secondary | ICD-10-CM

## 2020-08-31 NOTE — Progress Notes (Signed)
Patient ID: [name], male    DOB: 06-20-1966, 55 y.o.   MRN: [id number]  This visit was conducted in person.  BP (!) 142/70   Pulse 85   Temp 98.5 F (36.9 C) (Temporal)   Ht 5' 8.5" (1.74 m)   Wt 158 lb 4 oz (71.8 kg)   SpO2 97%   BMI 23.71 kg/m    CC:  Chief Complaint  Patient presents with  . Diabetes    Subjective:   HPI: Joseph Lam is a 55 y.o. male presenting on 08/31/2020 for Diabetes   Has been under a lot of stress.. mother passed from CVA in last 3 weeks.  Diabetes:  Excellent control.  On januvia and metformin. Off glipizide Lab Results  Component Value Date   HGBA1C 6.1 08/27/2020  Using medications without difficulties: Hypoglycemic episodes: none Hyperglycemic episodes:none Feet problems: no ulcers Blood Sugars averaging:  Not checking regularly eye exam within last year: yes   Minimal exercise.. poor diet.. not eating much.  Plans setting up with hospice counselor.  Wt Readings from Last 3 Encounters:  08/31/20 158 lb 4 oz (71.8 kg)  04/13/20 161 lb (73 kg)  03/30/20 161 lb (73 kg)        Relevant past medical, surgical, family and social history reviewed and updated as indicated. Interim medical history since our last visit reviewed. Allergies and medications reviewed and updated. Outpatient Medications Prior to Visit  Medication Sig Dispense Refill  . acetaminophen (TYLENOL) 500 MG tablet Take 1,000 mg by mouth in the morning and at bedtime.    Marland Kitchen aspirin 81 MG tablet Take 81 mg by mouth daily.    Marland Kitchen atorvastatin (LIPITOR) 40 MG tablet TAKE 1 TABLET BY MOUTH EVERY DAY 90 tablet 3  . Blood Glucose Monitoring Suppl (ONE TOUCH ULTRA 2) w/Device KIT CHECK BLOOD SUGAR DAILY  0  . Choline Fenofibrate (FENOFIBRIC ACID) 135 MG CPDR Take 1 tablet by mouth daily. 90 capsule 3  . ibuprofen (ADVIL) 200 MG tablet Take 800 mg by mouth in the morning and at bedtime.    Marland Kitchen JANUVIA 100 MG tablet TAKE 1 TABLET BY MOUTH EVERY DAY 30 tablet 11  .  meloxicam (MOBIC) 7.5 MG tablet Mobic 7.5 mg tablet  Take 1 tablet twice a day by oral route.    . metFORMIN (GLUCOPHAGE) 500 MG tablet Take 2 tablets (1,000 mg total) by mouth at bedtime. 180 tablet 1  . metroNIDAZOLE (METROGEL) 1 % gel Apply topically daily. (Patient taking differently: Apply 1 application topically daily as needed (Irritation).) 45 g 0  . MICROLET LANCETS MISC Check blood sugar 1-2 times daily and as directed. 250.00 100 each 11  . Multiple Vitamins-Minerals (MULTIVITAMIN WITH MINERALS) tablet Take 1 tablet by mouth at bedtime.    . Omega-3 Fatty Acids (FISH OIL) 1000 MG CAPS Take 1,000 mg by mouth at bedtime.     . Omeprazole Magnesium (PRILOSEC OTC PO) Take 20 mg by mouth at bedtime.     Glory Rosebush ULTRA test strip CHECK BLOOD SUGAR DAILY 100 each 3  . traMADol (ULTRAM) 50 MG tablet Take 50 mg by mouth every 6 (six) hours as needed.    . traZODone (DESYREL) 50 MG tablet TAKE 1/2 TO 1 TABLET (25-50 MG TOTAL) BY MOUTH AT BEDTIME AS NEEDED FOR SLEEP. 90 tablet 0  . triamcinolone cream (KENALOG) 0.1 % Apply 1 application topically 2 (two) times daily. 30 g 0   No facility-administered medications  prior to visit.     Per HPI unless specifically indicated in ROS section below Review of Systems  Constitutional: Negative for fatigue and fever.  HENT: Negative for ear pain.   Eyes: Negative for pain.  Respiratory: Negative for cough and shortness of breath.   Cardiovascular: Negative for chest pain, palpitations and leg swelling.  Gastrointestinal: Negative for abdominal pain.  Genitourinary: Negative for dysuria.  Musculoskeletal: Negative for arthralgias.  Neurological: Negative for syncope, light-headedness and headaches.  Psychiatric/Behavioral: Negative for dysphoric mood.   Objective:  BP (!) 142/70   Pulse 85   Temp 98.5 F (36.9 C) (Temporal)   Ht 5' 8.5" (1.74 m)   Wt 158 lb 4 oz (71.8 kg)   SpO2 97%   BMI 23.71 kg/m   Wt Readings from Last 3 Encounters:   08/31/20 158 lb 4 oz (71.8 kg)  04/13/20 161 lb (73 kg)  03/30/20 161 lb (73 kg)      Physical Exam Constitutional:      General: Vital signs are normal.     Appearance: He is well-developed and well-nourished.  HENT:     Head: Normocephalic.     Right Ear: Hearing normal.     Left Ear: Hearing normal.     Nose: Nose normal.     Mouth/Throat:     Mouth: Oropharynx is clear and moist and mucous membranes are normal.  Neck:     Thyroid: No thyroid mass or thyromegaly.     Vascular: No carotid bruit.     Trachea: Trachea normal.  Cardiovascular:     Rate and Rhythm: Normal rate and regular rhythm.     Pulses: Normal pulses.     Heart sounds: Heart sounds not distant. No murmur heard. No friction rub. No gallop.      Comments: No peripheral edema Pulmonary:     Effort: Pulmonary effort is normal. No respiratory distress.     Breath sounds: Normal breath sounds.  Skin:    General: Skin is warm, dry and intact.     Findings: No rash.  Psychiatric:        Mood and Affect: Mood and affect normal.        Speech: Speech normal.        Behavior: Behavior normal.        Thought Content: Thought content normal.       Diabetic foot exam: Normal inspection No skin breakdown No calluses  Normal DP pulses Normal sensation to light touch and monofilament Nails normal  Results for orders placed or performed in visit on 08/27/20  Comprehensive metabolic panel  Result Value Ref Range   Sodium 141 135 - 145 mEq/L   Potassium 3.7 3.5 - 5.1 mEq/L   Chloride 105 96 - 112 mEq/L   CO2 30 19 - 32 mEq/L   Glucose, Bld 100 (H) 70 - 99 mg/dL   BUN 15 6 - 23 mg/dL   Creatinine, Ser 0.98 0.40 - 1.50 mg/dL   Total Bilirubin 0.6 0.2 - 1.2 mg/dL   Alkaline Phosphatase 29 (L) 39 - 117 U/L   AST 19 0 - 37 U/L   ALT 13 0 - 53 U/L   Total Protein 7.1 6.0 - 8.3 g/dL   Albumin 4.6 3.5 - 5.2 g/dL   GFR 86.20 >60.00 mL/min   Calcium 9.4 8.4 - 10.5 mg/dL  Lipid panel  Result Value Ref Range    Cholesterol 117 0 - 200 mg/dL   Triglycerides 61.0 0.0 -  149.0 mg/dL   HDL 48.40 >39.00 mg/dL   VLDL 12.2 0.0 - 40.0 mg/dL   LDL Cholesterol 56 0 - 99 mg/dL   Total CHOL/HDL Ratio 2    NonHDL 68.30   Hemoglobin A1c  Result Value Ref Range   Hgb A1c MFr Bld 6.1 4.6 - 6.5 %    This visit occurred during the SARS-CoV-2 public health emergency.  Safety protocols were in place, including screening questions prior to the visit, additional usage of staff PPE, and extensive cleaning of exam room while observing appropriate contact time as indicated for disinfecting solutions.   COVID 19 screen:  No recent travel or known exposure to COVID19 The patient denies respiratory symptoms of COVID 19 at this time. The importance of social distancing was discussed today.   Assessment and Plan Problem List Items Addressed This Visit    Chronic insomnia     Temporarily worse now better.  Continue trazodone 50 mg at night.      Grief reaction     Has upcoming appt with hospice counselor. Info given about [facility] BH options as well.  he will keep me updated if not improving.  He denies substance use to self treat.        Type II diabetes mellitus, well controlled ([place]) - Primary    Stable, chronic.  Continue current medication.  Metfomrin 500 mg 2 tabs daily  Januvia 100 mg daily        Other Visit Diagnoses    Need for influenza vaccination       Relevant Orders   Flu Vaccine QUAD 6+ mos PF IM (Fluarix Quad PF) (Completed)         Joseph Lofts, MD

## 2020-08-31 NOTE — Progress Notes (Signed)
No critical labs need to be addressed urgently. We will discuss labs in detail at upcoming office visit.   

## 2020-08-31 NOTE — Patient Instructions (Signed)
Start counseling as planned through hospice.  Call if mood not improving.

## 2020-08-31 NOTE — Assessment & Plan Note (Signed)
Has upcoming appt with hospice counselor. Info given about [facility] BH options as well.  he will keep me updated if not improving.  He denies substance use to self treat.

## 2020-08-31 NOTE — Assessment & Plan Note (Signed)
Temporarily worse now better.  Continue trazodone 50 mg at night.

## 2020-08-31 NOTE — Assessment & Plan Note (Signed)
Stable, chronic.  Continue current medication.    Metfomrin 500 mg 2 tabs daily Januvia 100 mg daily 

## 2020-10-18 ENCOUNTER — Other Ambulatory Visit: Payer: Self-pay | Admitting: Family Medicine

## 2020-11-16 ENCOUNTER — Other Ambulatory Visit: Payer: Self-pay | Admitting: Family Medicine

## 2021-02-22 ENCOUNTER — Telehealth: Payer: Self-pay | Admitting: Family Medicine

## 2021-02-22 DIAGNOSIS — E119 Type 2 diabetes mellitus without complications: Secondary | ICD-10-CM

## 2021-02-22 DIAGNOSIS — Z125 Encounter for screening for malignant neoplasm of prostate: Secondary | ICD-10-CM

## 2021-02-22 NOTE — Telephone Encounter (Signed)
-----   Message from Cloyd Stagers, RT sent at 02/07/2021 10:35 AM EDT ----- Regarding: Lab Orders for Wednesday 8.10.2022 Please place lab orders for Wednesday 8.10.2022, office visit for physical on Tuesday 8.16.2022 Thank you, Dyke Maes RT(R)

## 2021-02-23 ENCOUNTER — Other Ambulatory Visit: Payer: BC Managed Care – PPO

## 2021-02-24 ENCOUNTER — Other Ambulatory Visit (INDEPENDENT_AMBULATORY_CARE_PROVIDER_SITE_OTHER): Payer: BC Managed Care – PPO

## 2021-02-24 ENCOUNTER — Other Ambulatory Visit: Payer: Self-pay

## 2021-02-24 DIAGNOSIS — Z125 Encounter for screening for malignant neoplasm of prostate: Secondary | ICD-10-CM

## 2021-02-24 DIAGNOSIS — E119 Type 2 diabetes mellitus without complications: Secondary | ICD-10-CM

## 2021-02-24 LAB — LIPID PANEL
Cholesterol: 143 mg/dL (ref 0–200)
HDL: 46.9 mg/dL (ref >39.00)
LDL Cholesterol: 65 mg/dL (ref 0–99)
NonHDL: 96.12
Total CHOL/HDL Ratio: 3
Triglycerides: 156 mg/dL — ABNORMAL HIGH (ref 0.0–149.0)
VLDL: 31.2 mg/dL (ref 0.0–40.0)

## 2021-02-24 LAB — MICROALBUMIN / CREATININE URINE RATIO
Creatinine,U: 133.2 mg/dL
Microalb Creat Ratio: 0.7 mg/g (ref 0.0–30.0)
Microalb, Ur: 0.9 mg/dL (ref 0.0–1.9)

## 2021-02-24 LAB — COMPREHENSIVE METABOLIC PANEL
ALT: 12 U/L (ref 0–53)
AST: 15 U/L (ref 0–37)
Albumin: 4.3 g/dL (ref 3.5–5.2)
Alkaline Phosphatase: 40 U/L (ref 39–117)
BUN: 15 mg/dL (ref 6–23)
CO2: 26 mEq/L (ref 19–32)
Calcium: 9.3 mg/dL (ref 8.4–10.5)
Chloride: 105 mEq/L (ref 96–112)
Creatinine, Ser: 0.89 mg/dL (ref 0.40–1.50)
GFR: 95.46 mL/min (ref >60.00)
Glucose, Bld: 112 mg/dL — ABNORMAL HIGH (ref 70–99)
Potassium: 4.3 mEq/L (ref 3.5–5.1)
Sodium: 140 mEq/L (ref 135–145)
Total Bilirubin: 0.6 mg/dL (ref 0.2–1.2)
Total Protein: 6.6 g/dL (ref 6.0–8.3)

## 2021-02-24 LAB — HEMOGLOBIN A1C: Hgb A1c MFr Bld: 6.3 % (ref 4.6–6.5)

## 2021-02-24 LAB — PSA: PSA: 1.71 ng/mL (ref 0.10–4.00)

## 2021-02-25 NOTE — Progress Notes (Signed)
No critical labs need to be addressed urgently. We will discuss labs in detail at upcoming office visit.   

## 2021-03-01 ENCOUNTER — Encounter: Payer: Self-pay | Admitting: Family Medicine

## 2021-03-01 ENCOUNTER — Other Ambulatory Visit: Payer: Self-pay

## 2021-03-01 ENCOUNTER — Ambulatory Visit (INDEPENDENT_AMBULATORY_CARE_PROVIDER_SITE_OTHER): Payer: BC Managed Care – PPO | Admitting: Family Medicine

## 2021-03-01 VITALS — BP 112/74 | HR 83 | Temp 97.8°F | Ht 68.75 in | Wt 153.0 lb

## 2021-03-01 DIAGNOSIS — Z0000 Encounter for general adult medical examination without abnormal findings: Secondary | ICD-10-CM | POA: Diagnosis not present

## 2021-03-01 DIAGNOSIS — E785 Hyperlipidemia, unspecified: Secondary | ICD-10-CM | POA: Diagnosis not present

## 2021-03-01 DIAGNOSIS — E119 Type 2 diabetes mellitus without complications: Secondary | ICD-10-CM | POA: Diagnosis not present

## 2021-03-01 DIAGNOSIS — E1169 Type 2 diabetes mellitus with other specified complication: Secondary | ICD-10-CM

## 2021-03-01 NOTE — Progress Notes (Signed)
Patient ID: Joseph Lam, male    DOB: [birth year], 55 y.o.   MRN: [id number]  This visit was conducted in person.  BP 112/74   Pulse 83   Temp 97.8 F (36.6 C) (Temporal)   Ht 5' 8.75" (1.746 m)   Wt 153 lb (69.4 kg)   SpO2 98%   BMI 22.76 kg/m    CC: Chief Complaint  Patient presents with   Annual Exam    Subjective:   HPI: Joseph Lam is a 55 y.o. male presenting on 03/01/2021 for Annual Exam  Diabetes:  At goal on current regimen. Lab Results  Component Value Date   HGBA1C 6.3 02/24/2021  Using medications without difficulties: Hypoglycemic episodes: none Hyperglycemic episodes:none Feet problems:none Blood Sugars averaging:  not  checking eye exam within last year: due.. scheduled Wt Readings from Last 3 Encounters:  03/01/21 153 lb (69.4 kg)  08/31/20 158 lb 4 oz (71.8 kg)  04/13/20 161 lb (73 kg)  Body mass index is 22.76 kg/m.   Elevated Cholesterol:  LDL at goal < 100 on atorvastatin 40 mg daily Lab Results  Component Value Date   CHOL 143 02/24/2021   HDL 46.90 02/24/2021   LDLCALC 65 02/24/2021   LDLDIRECT 96.0 12/27/2017   TRIG 156.0 (H) 02/24/2021   CHOLHDL 3 02/24/2021  Using medications without problems:none Muscle aches: none Diet compliance: hearth healthy Exercise: minimal Other complaints:  Mild intermittent asthma: no flares.      Relevant past medical, surgical, family and social history reviewed and updated as indicated. Interim medical history since our last visit reviewed. Allergies and medications reviewed and updated. Outpatient Medications Prior to Visit  Medication Sig Dispense Refill   acetaminophen (TYLENOL) 500 MG tablet Take 1,000 mg by mouth in the morning and at bedtime.     aspirin 81 MG tablet Take 81 mg by mouth daily.     atorvastatin (LIPITOR) 40 MG tablet TAKE 1 TABLET BY MOUTH EVERY DAY 90 tablet 3   Blood Glucose Monitoring Suppl (ONE TOUCH ULTRA 2) w/Device KIT CHECK BLOOD SUGAR DAILY  0   Choline  Fenofibrate (FENOFIBRIC ACID) 135 MG CPDR Take 1 tablet by mouth daily. 90 capsule 3   ibuprofen (ADVIL) 200 MG tablet Take 800 mg by mouth in the morning and at bedtime.     JANUVIA 100 MG tablet TAKE 1 TABLET BY MOUTH EVERY DAY 30 tablet 11   meloxicam (MOBIC) 7.5 MG tablet Mobic 7.5 mg tablet  Take 1 tablet twice a day by oral route.     metFORMIN (GLUCOPHAGE) 500 MG tablet TAKE 2 TABLETS (1,000 MG TOTAL) BY MOUTH AT BEDTIME. 180 tablet 1   MICROLET LANCETS MISC Check blood sugar 1-2 times daily and as directed. 250.00 100 each 11   Multiple Vitamins-Minerals (MULTIVITAMIN WITH MINERALS) tablet Take 1 tablet by mouth at bedtime.     Omega-3 Fatty Acids (FISH OIL) 1000 MG CAPS Take 1,000 mg by mouth at bedtime.      Omeprazole Magnesium (PRILOSEC OTC PO) Take 20 mg by mouth at bedtime.      ONETOUCH ULTRA test strip CHECK BLOOD SUGAR DAILY 100 each 3   traMADol (ULTRAM) 50 MG tablet Take 50 mg by mouth every 6 (six) hours as needed.     traZODone (DESYREL) 50 MG tablet TAKE 1/2 TO 1 TABLET (25-50 MG TOTAL) BY MOUTH AT BEDTIME AS NEEDED FOR SLEEP. 90 tablet 0   triamcinolone cream (KENALOG) 0.1 % Apply 1  application topically 2 (two) times daily. 30 g 0   metroNIDAZOLE (METROGEL) 1 % gel Apply topically daily. (Patient taking differently: Apply 1 application topically daily as needed (Irritation).) 45 g 0   No facility-administered medications prior to visit.     Per HPI unless specifically indicated in ROS section below Review of Systems  Constitutional:  Negative for fatigue and fever.  HENT:  Negative for ear pain.   Eyes:  Negative for pain.  Respiratory:  Negative for cough and shortness of breath.   Cardiovascular:  Negative for chest pain, palpitations and leg swelling.  Gastrointestinal:  Negative for abdominal pain.  Genitourinary:  Negative for dysuria.  Musculoskeletal:  Negative for arthralgias.  Neurological:  Negative for syncope, light-headedness and headaches.   Psychiatric/Behavioral:  Negative for dysphoric mood.   Objective:  BP 112/74   Pulse 83   Temp 97.8 F (36.6 C) (Temporal)   Ht 5' 8.75" (1.746 m)   Wt 153 lb (69.4 kg)   SpO2 98%   BMI 22.76 kg/m   Wt Readings from Last 3 Encounters:  03/01/21 153 lb (69.4 kg)  08/31/20 158 lb 4 oz (71.8 kg)  04/13/20 161 lb (73 kg)      Physical Exam Constitutional:      General: He is not in acute distress.    Appearance: Normal appearance. He is well-developed. He is not ill-appearing or toxic-appearing.  HENT:     Head: Normocephalic and atraumatic.     Right Ear: Hearing, tympanic membrane, ear canal and external ear normal.     Left Ear: Hearing, tympanic membrane, ear canal and external ear normal.     Nose: Nose normal.     Mouth/Throat:     Pharynx: Uvula midline.  Eyes:     General: Lids are normal. Lids are everted, no foreign bodies appreciated.     Conjunctiva/sclera: Conjunctivae normal.     Pupils: Pupils are equal, round, and reactive to light.  Neck:     Thyroid: No thyroid mass or thyromegaly.     Vascular: No carotid bruit.     Trachea: Trachea and phonation normal.  Cardiovascular:     Rate and Rhythm: Normal rate and regular rhythm.     Pulses: Normal pulses.     Heart sounds: S1 normal and S2 normal. No murmur heard.   No gallop.  Pulmonary:     Breath sounds: Normal breath sounds. No wheezing, rhonchi or rales.  Abdominal:     General: Bowel sounds are normal.     Palpations: Abdomen is soft.     Tenderness: There is no abdominal tenderness. There is no guarding or rebound.     Hernia: No hernia is present.  Musculoskeletal:     Cervical back: Normal range of motion and neck supple.  Lymphadenopathy:     Cervical: No cervical adenopathy.  Skin:    General: Skin is warm and dry.     Findings: No rash.  Neurological:     Mental Status: He is alert.     Cranial Nerves: No cranial nerve deficit.     Sensory: No sensory deficit.     Gait: Gait normal.      Deep Tendon Reflexes: Reflexes are normal and symmetric.  Psychiatric:        Speech: Speech normal.        Behavior: Behavior normal.        Judgment: Judgment normal.      Results for orders placed or performed  in visit on 02/24/21  PSA  Result Value Ref Range   PSA 1.71 0.10 - 4.00 ng/mL  Microalbumin / creatinine urine ratio  Result Value Ref Range   Microalb, Ur 0.9 0.0 - 1.9 mg/dL   Creatinine,U 133.2 mg/dL   Microalb Creat Ratio 0.7 0.0 - 30.0 mg/g  Comprehensive metabolic panel  Result Value Ref Range   Sodium 140 135 - 145 mEq/L   Potassium 4.3 3.5 - 5.1 mEq/L   Chloride 105 96 - 112 mEq/L   CO2 26 19 - 32 mEq/L   Glucose, Bld 112 (H) 70 - 99 mg/dL   BUN 15 6 - 23 mg/dL   Creatinine, Ser 0.89 0.40 - 1.50 mg/dL   Total Bilirubin 0.6 0.2 - 1.2 mg/dL   Alkaline Phosphatase 40 39 - 117 U/L   AST 15 0 - 37 U/L   ALT 12 0 - 53 U/L   Total Protein 6.6 6.0 - 8.3 g/dL   Albumin 4.3 3.5 - 5.2 g/dL   GFR 95.46 >60.00 mL/min   Calcium 9.3 8.4 - 10.5 mg/dL  Lipid panel  Result Value Ref Range   Cholesterol 143 0 - 200 mg/dL   Triglycerides 156.0 (H) 0.0 - 149.0 mg/dL   HDL 46.90 >39.00 mg/dL   VLDL 31.2 0.0 - 40.0 mg/dL   LDL Cholesterol 65 0 - 99 mg/dL   Total CHOL/HDL Ratio 3    NonHDL 96.12   Hemoglobin A1c  Result Value Ref Range   Hgb A1c MFr Bld 6.3 4.6 - 6.5 %    This visit occurred during the SARS-CoV-2 public health emergency.  Safety protocols were in place, including screening questions prior to the visit, additional usage of staff PPE, and extensive cleaning of exam room while observing appropriate contact time as indicated for disinfecting solutions.   COVID 19 screen:  No recent travel or known exposure to COVID19 The patient denies respiratory symptoms of COVID 19 at this time. The importance of social distancing was discussed today.   Assessment and Plan The patient's preventative maintenance and recommended screening tests for an annual wellness  exam were reviewed in full today. Brought up to date unless services declined.  Counselled on the importance of diet, exercise, and its role in overall health and mortality. The patient's FH and SH was reviewed, including their home life, tobacco status, and drug and alcohol status.   Prostate cancer  In Dad  At age 42.   Lab Results  Component Value Date   PSA 1.71 02/24/2021   PSA 1.83 02/20/2020   PSA 1.65 08/07/2018  Vaccines:  Uptodate, COVID 19 x 2, PNA, Tdap, consider shingrix Colon cancer screening:  03/2020 Pyrtle repeat in 5 years Nonsmoker  HIV: refused. No ETOH.drug use Hep C: done EYE exam:  Due   Problem List Items Addressed This Visit     Hyperlipidemia associated with type 2 diabetes mellitus (HCC)    Stable, chronic.  Continue current medication.   LDL at goal < 100 on atorvastatin 40 mg daily      Type II diabetes mellitus, well controlled (HCC)    Stable, chronic.  Continue current medication.    Metfomrin 500 mg 2 tabs daily Januvia 100 mg daily      Other Visit Diagnoses     Routine general medical examination at a health care facility    -  Primary         Eliezer Lofts, MD

## 2021-03-01 NOTE — Assessment & Plan Note (Signed)
Stable, chronic.  Continue current medication.   LDL at goal < 100 on atorvastatin 40 mg daily

## 2021-03-01 NOTE — Assessment & Plan Note (Signed)
Stable, chronic.  Continue current medication.    Metfomrin 500 mg 2 tabs daily Januvia 100 mg daily

## 2021-03-01 NOTE — Patient Instructions (Signed)
Look into Shingrix series.

## 2021-03-18 ENCOUNTER — Other Ambulatory Visit: Payer: Self-pay | Admitting: Family Medicine

## 2021-04-16 ENCOUNTER — Other Ambulatory Visit: Payer: Self-pay | Admitting: Family Medicine

## 2021-08-16 ENCOUNTER — Telehealth: Payer: Self-pay | Admitting: Family Medicine

## 2021-08-16 DIAGNOSIS — E119 Type 2 diabetes mellitus without complications: Secondary | ICD-10-CM

## 2021-08-16 NOTE — Telephone Encounter (Signed)
-----   Message from Ellamae Sia sent at 08/16/2021  7:37 AM EST ----- Regarding: Lab orders for Thursday, 2.9.23 Lab orders for f/u

## 2021-08-18 IMAGING — MR MR SHOULDER*R* W/CM
5 series · 40 of 40 positions shown · IV contrast (agent unspecified)
Comparison: Right shoulder x-rays dated August 22, 2019.

CLINICAL DATA: Acute right shoulder pain for past month since a
fall.

EXAM:
MR ARTHROGRAM OF THE RIGHT SHOULDER
TECHNIQUE: Multiplanar, multisequence MR imaging of the right shoulder was
performed following the administration of intra-articular contrast.
CONTRAST:  See Injection Documentation.

[Series 5: T1 fat-sat · axial · right · 4.0mm · 0.55mm/px · z∈[-13,+107]mm · 9 of 25 slices shown (1 of 2)]
[im 1/25]
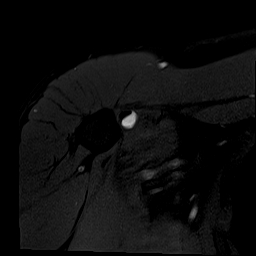
[im 4/25]
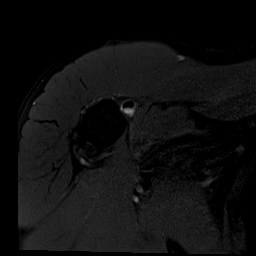
[im 7/25]
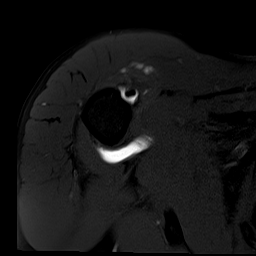
[im 10/25]
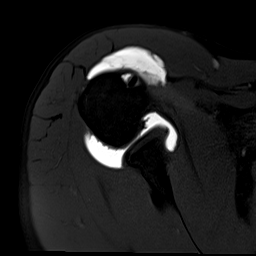
[im 13/25]
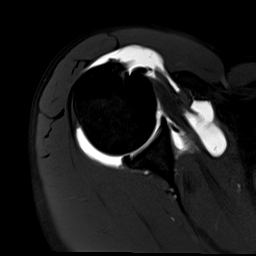
[im 16/25]
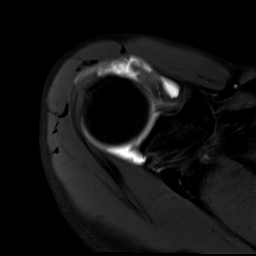
[im 19/25]
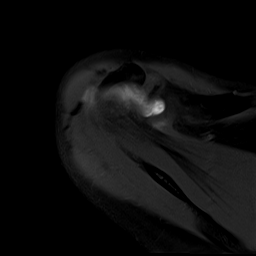
[im 22/25]
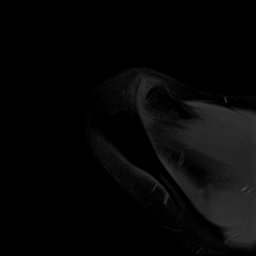
[im 25/25]
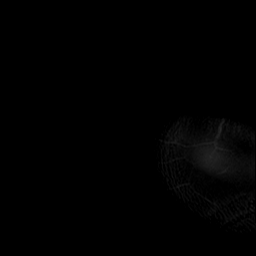

[Series 6: T1 fat-sat · oblique · right · 4.0mm · 0.55mm/px · 8 of 25 slices shown (2 of 2)]
[im 1/25]
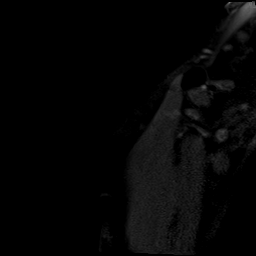
[im 4/25]
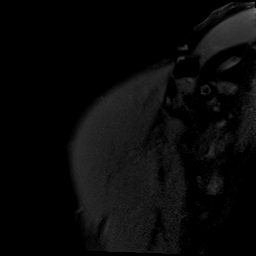
[im 7/25]
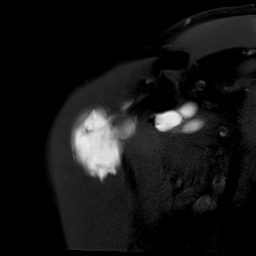
[im 11/25]
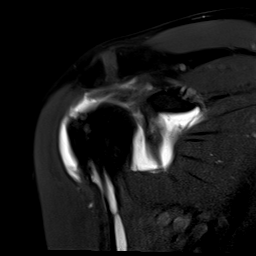
[im 14/25]
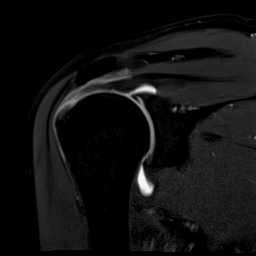
[im 18/25]
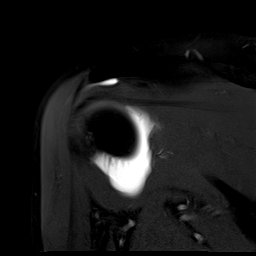
[im 21/25]
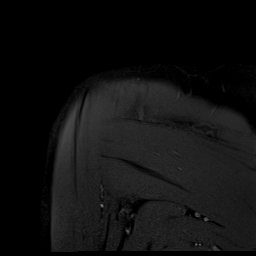
[im 25/25]
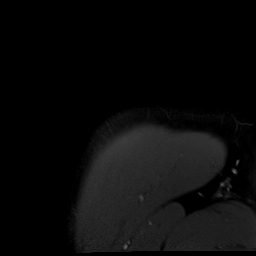

[Series 7: T2 fat-sat · oblique · right · 4.0mm · 0.55mm/px · 8 of 25 slices shown (1 of 2)]
[im 1/25]
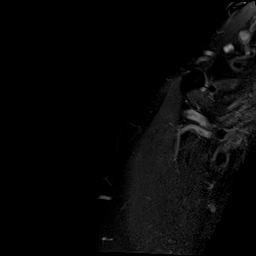
[im 4/25]
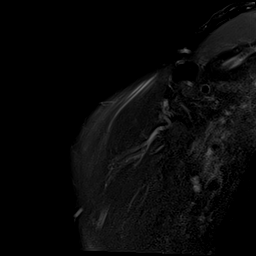
[im 7/25]
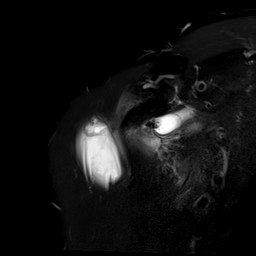
[im 11/25]
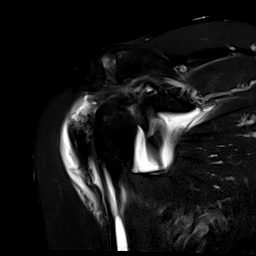
[im 14/25]
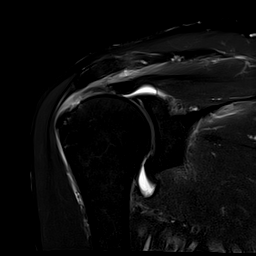
[im 18/25]
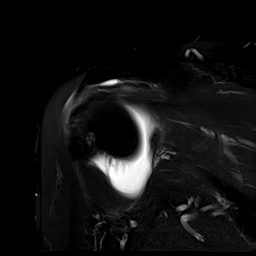
[im 21/25]
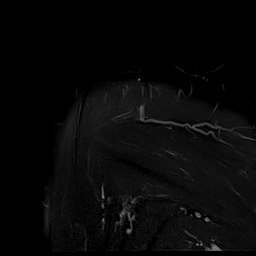
[im 25/25]
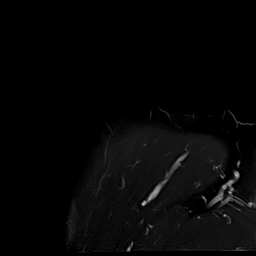

[Series 8: T1 · oblique · right · 4.0mm · 0.51mm/px · 8 of 25 slices shown]
[im 1/25]
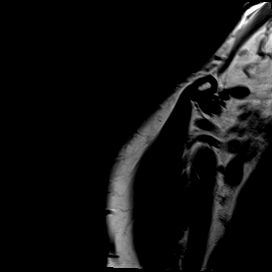
[im 4/25]
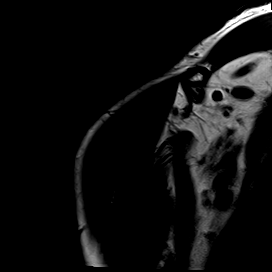
[im 7/25]
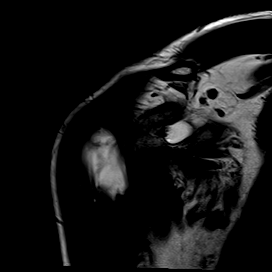
[im 11/25]
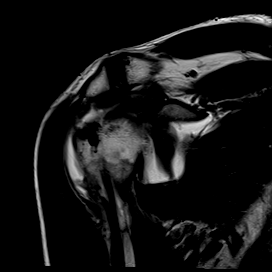
[im 14/25]
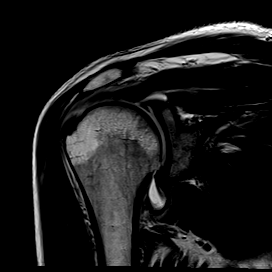
[im 18/25]
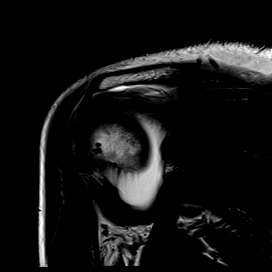
[im 21/25]
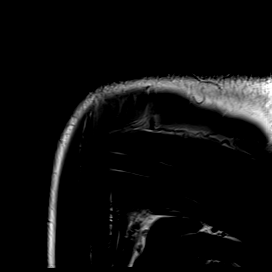
[im 25/25]
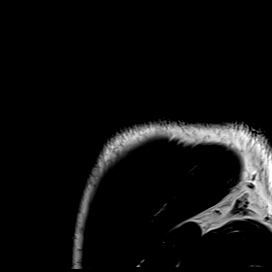

[Series 9: T2 fat-sat · oblique · right · 4.0mm · 0.55mm/px · 7 of 22 slices shown (2 of 2)]
[im 1/22]
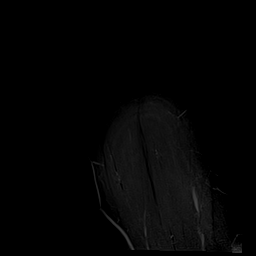
[im 4/22]
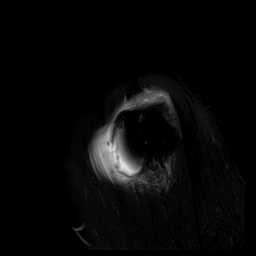
[im 8/22]
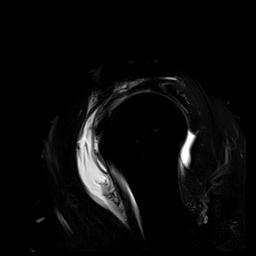
[im 11/22]
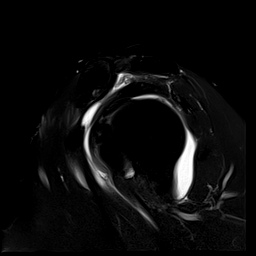
[im 15/22]
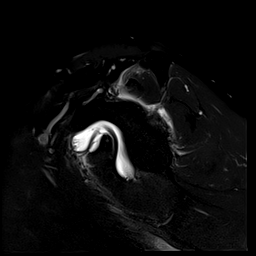
[im 18/22]
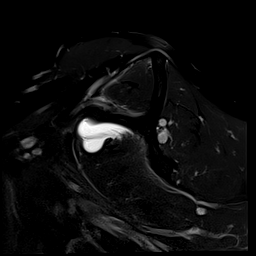
[im 22/22]
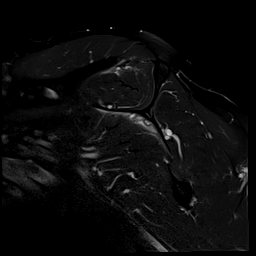

[40 of 40 positions shown; findings below may reference images not displayed]

FINDINGS: Rotator cuff: Full-thickness, essentially full width tear of the
distal supraspinatus tendon with 1.7 cm retraction to the top of the
humeral head. A few bursal surface posterior tendon fibers may
remain intact. Partial-thickness bursal surface of the subscapularis
tendon at the insertion (series 5, images 12-14). The infraspinatus
and teres minor tendons are unremarkable.

Muscles:  No focal muscular atrophy or edema.

Biceps long head:  Intact but medially subluxed.

Acromioclavicular Joint: The acromion is type I. There are mild
acromioclavicular degenerative changes. Large amount of contrast
within the subacromial/subdeltoid bursa.

Glenohumeral Joint: Distended with intra-articular contrast. No
chondral defect.

Labrum: Superior labral tear (series 6, images 11-13). Remaining
labrum is intact.

Bones: No acute fracture or dislocation. No suspicious bone lesion.
Small subcortical cyst in the anterior greater tuberosity.

Other: None.
IMPRESSION: 1. Full-thickness, essentially full width tear of the distal
supraspinatus tendon with 1.7 cm retraction to the top of the
humeral head. No atrophy.
2. Partial-thickness bursal surface tear of the subscapularis tendon
at the insertion. Medial subluxation of the biceps tendon.
3. SLAP tear.
4. Mild acromioclavicular osteoarthritis.

## 2021-08-25 ENCOUNTER — Other Ambulatory Visit: Payer: BC Managed Care – PPO

## 2021-08-29 LAB — HM DIABETES FOOT EXAM

## 2021-09-01 ENCOUNTER — Ambulatory Visit: Payer: BC Managed Care – PPO | Admitting: Family Medicine

## 2021-09-07 ENCOUNTER — Other Ambulatory Visit: Payer: Self-pay

## 2021-09-07 ENCOUNTER — Other Ambulatory Visit (INDEPENDENT_AMBULATORY_CARE_PROVIDER_SITE_OTHER): Payer: BC Managed Care – PPO

## 2021-09-07 DIAGNOSIS — E119 Type 2 diabetes mellitus without complications: Secondary | ICD-10-CM

## 2021-09-07 LAB — LIPID PANEL
Cholesterol: 144 mg/dL (ref 0–200)
HDL: 47 mg/dL
LDL Cholesterol: 65 mg/dL (ref 0–99)
NonHDL: 96.94
Total CHOL/HDL Ratio: 3
Triglycerides: 161 mg/dL — ABNORMAL HIGH (ref 0.0–149.0)
VLDL: 32.2 mg/dL (ref 0.0–40.0)

## 2021-09-07 LAB — COMPREHENSIVE METABOLIC PANEL WITH GFR
ALT: 13 U/L (ref 0–53)
AST: 13 U/L (ref 0–37)
Albumin: 4.3 g/dL (ref 3.5–5.2)
Alkaline Phosphatase: 37 U/L — ABNORMAL LOW (ref 39–117)
BUN: 15 mg/dL (ref 6–23)
CO2: 31 meq/L (ref 19–32)
Calcium: 9.1 mg/dL (ref 8.4–10.5)
Chloride: 105 meq/L (ref 96–112)
Creatinine, Ser: 0.86 mg/dL (ref 0.40–1.50)
GFR: 96.09 mL/min
Glucose, Bld: 98 mg/dL (ref 70–99)
Potassium: 3.8 meq/L (ref 3.5–5.1)
Sodium: 139 meq/L (ref 135–145)
Total Bilirubin: 0.6 mg/dL (ref 0.2–1.2)
Total Protein: 6.6 g/dL (ref 6.0–8.3)

## 2021-09-07 LAB — HEMOGLOBIN A1C: Hgb A1c MFr Bld: 6.2 % (ref 4.6–6.5)

## 2021-09-07 NOTE — Progress Notes (Signed)
No critical labs need to be addressed urgently. We will discuss labs in detail at upcoming office visit.   

## 2021-09-09 ENCOUNTER — Other Ambulatory Visit: Payer: Self-pay

## 2021-09-09 ENCOUNTER — Encounter: Payer: Self-pay | Admitting: Family Medicine

## 2021-09-09 ENCOUNTER — Ambulatory Visit: Payer: BC Managed Care – PPO | Admitting: Family Medicine

## 2021-09-09 VITALS — BP 130/82 | HR 84 | Temp 98.4°F | Ht 68.75 in | Wt 154.3 lb

## 2021-09-09 DIAGNOSIS — F5104 Psychophysiologic insomnia: Secondary | ICD-10-CM

## 2021-09-09 DIAGNOSIS — E119 Type 2 diabetes mellitus without complications: Secondary | ICD-10-CM

## 2021-09-09 DIAGNOSIS — E785 Hyperlipidemia, unspecified: Secondary | ICD-10-CM | POA: Diagnosis not present

## 2021-09-09 DIAGNOSIS — E1169 Type 2 diabetes mellitus with other specified complication: Secondary | ICD-10-CM

## 2021-09-09 MED ORDER — TRAZODONE HCL 50 MG PO TABS: ORAL_TABLET | ORAL | 1 refills | Status: DC

## 2021-09-09 NOTE — Assessment & Plan Note (Signed)
Stable, chronic.  Continue current medication.    trazodone 1/2 to 1 tab of 50 mg daily at bedtime

## 2021-09-09 NOTE — Progress Notes (Signed)
Patient ID: Joseph Lam, male    DOB: 04/19/1966, 56 y.o.   MRN: [id number]  This visit was conducted in person.  BP 130/82    Pulse 84    Temp 98.4 F (36.9 C) (Temporal)    Ht 5' 8.75" (1.746 m)    Wt 154 lb 5 oz (70 kg)    SpO2 98%    BMI 22.95 kg/m    CC:  Chief Complaint  Patient presents with   Diabetes    Subjective:   HPI: Joseph Lam is a 56 y.o. male presenting on 09/09/2021 for Diabetes  Diabetes:  great control on  Januvia 100 mg daily and metformin 500 mg 2 tabs daily. Lab Results  Component Value Date   HGBA1C 6.2 09/07/2021  Using medications without difficulties: Hypoglycemic episodes: Hyperglycemic episodes: Feet problems: no ulcers Blood Sugars averaging: not check eye exam within last year: yes  BP Readings from Last 3 Encounters:  09/09/21 130/82  03/01/21 112/74  08/31/20 (!) 142/70    Elevated Cholesterol:  At goal < 100 on atorvastatin 40 mg daily Lab Results  Component Value Date   CHOL 144 09/07/2021   HDL 47.00 09/07/2021   LDLCALC 65 09/07/2021   LDLDIRECT 96.0 12/27/2017   TRIG 161.0 (H) 09/07/2021   CHOLHDL 3 09/07/2021  Using medications without problems: Muscle aches:  Diet compliance: good Exercise: trying to get back to exercise. Other complaints:   Chronic insomnia: trazodone working well without SE.      Relevant past medical, surgical, family and social history reviewed and updated as indicated. Interim medical history since our last visit reviewed. Allergies and medications reviewed and updated. Outpatient Medications Prior to Visit  Medication Sig Dispense Refill   acetaminophen (TYLENOL) 500 MG tablet Take 1,000 mg by mouth in the morning and at bedtime.     aspirin 81 MG tablet Take 81 mg by mouth daily.     atorvastatin (LIPITOR) 40 MG tablet TAKE 1 TABLET BY MOUTH EVERY DAY 90 tablet 3   Blood Glucose Monitoring Suppl (ONE TOUCH ULTRA 2) w/Device KIT CHECK BLOOD SUGAR DAILY  0   Choline Fenofibrate  (FENOFIBRIC ACID) 135 MG CPDR Take 1 tablet by mouth daily. 90 capsule 3   ibuprofen (ADVIL) 200 MG tablet Take 800 mg by mouth in the morning and at bedtime.     JANUVIA 100 MG tablet TAKE 1 TABLET BY MOUTH EVERY DAY 30 tablet 11   meloxicam (MOBIC) 7.5 MG tablet Mobic 7.5 mg tablet  Take 1 tablet twice a day by oral route.     metFORMIN (GLUCOPHAGE) 500 MG tablet TAKE 2 TABLETS (1,000 MG TOTAL) BY MOUTH AT BEDTIME. 180 tablet 3   MICROLET LANCETS MISC Check blood sugar 1-2 times daily and as directed. 250.00 100 each 11   Multiple Vitamins-Minerals (MULTIVITAMIN WITH MINERALS) tablet Take 1 tablet by mouth at bedtime.     Omega-3 Fatty Acids (FISH OIL) 1000 MG CAPS Take 1,000 mg by mouth at bedtime.      Omeprazole Magnesium (PRILOSEC OTC PO) Take 20 mg by mouth at bedtime.      ONETOUCH ULTRA test strip CHECK BLOOD SUGAR DAILY 100 each 3   traZODone (DESYREL) 50 MG tablet TAKE 1/2 TO 1 TABLET (25-50 MG TOTAL) BY MOUTH AT BEDTIME AS NEEDED FOR SLEEP. 90 tablet 0   triamcinolone cream (KENALOG) 0.1 % Apply 1 application topically 2 (two) times daily. 30 g 0   traMADol (ULTRAM) 50  MG tablet Take 50 mg by mouth every 6 (six) hours as needed.     No facility-administered medications prior to visit.     Per HPI unless specifically indicated in ROS section below Review of Systems  Constitutional:  Negative for fatigue and fever.  HENT:  Negative for ear pain.   Eyes:  Negative for pain.  Respiratory:  Negative for cough and shortness of breath.   Cardiovascular:  Negative for chest pain, palpitations and leg swelling.  Gastrointestinal:  Negative for abdominal pain.  Genitourinary:  Negative for dysuria.  Musculoskeletal:  Negative for arthralgias.  Neurological:  Negative for syncope, light-headedness and headaches.  Psychiatric/Behavioral:  Negative for dysphoric mood.   Objective:  BP 130/82    Pulse 84    Temp 98.4 F (36.9 C) (Temporal)    Ht 5' 8.75" (1.746 m)    Wt 154 lb 5 oz (70  kg)    SpO2 98%    BMI 22.95 kg/m   Wt Readings from Last 3 Encounters:  09/09/21 154 lb 5 oz (70 kg)  03/01/21 153 lb (69.4 kg)  08/31/20 158 lb 4 oz (71.8 kg)      Physical Exam Constitutional:      Appearance: He is well-developed.  HENT:     Head: Normocephalic.     Right Ear: Hearing normal.     Left Ear: Hearing normal.     Nose: Nose normal.  Neck:     Thyroid: No thyroid mass or thyromegaly.     Vascular: No carotid bruit.     Trachea: Trachea normal.  Cardiovascular:     Rate and Rhythm: Normal rate and regular rhythm.     Pulses: Normal pulses.     Heart sounds: Heart sounds not distant. No murmur heard.   No friction rub. No gallop.     Comments: No peripheral edema Pulmonary:     Effort: Pulmonary effort is normal. No respiratory distress.     Breath sounds: Normal breath sounds.  Skin:    General: Skin is warm and dry.     Findings: No rash.  Psychiatric:        Speech: Speech normal.        Behavior: Behavior normal.        Thought Content: Thought content normal.      Diabetic foot exam: Normal inspection No skin breakdown No calluses  Normal DP pulses Normal sensation to light touch and monofilament Nails normal  Results for orders placed or performed in visit on 09/07/21  Comprehensive metabolic panel  Result Value Ref Range   Sodium 139 135 - 145 mEq/L   Potassium 3.8 3.5 - 5.1 mEq/L   Chloride 105 96 - 112 mEq/L   CO2 31 19 - 32 mEq/L   Glucose, Bld 98 70 - 99 mg/dL   BUN 15 6 - 23 mg/dL   Creatinine, Ser 0.86 0.40 - 1.50 mg/dL   Total Bilirubin 0.6 0.2 - 1.2 mg/dL   Alkaline Phosphatase 37 (L) 39 - 117 U/L   AST 13 0 - 37 U/L   ALT 13 0 - 53 U/L   Total Protein 6.6 6.0 - 8.3 g/dL   Albumin 4.3 3.5 - 5.2 g/dL   GFR 96.09 >60.00 mL/min   Calcium 9.1 8.4 - 10.5 mg/dL  Lipid panel  Result Value Ref Range   Cholesterol 144 0 - 200 mg/dL   Triglycerides 161.0 (H) 0.0 - 149.0 mg/dL   HDL 47.00 >39.00 mg/dL  VLDL 32.2 0.0 - 40.0 mg/dL    LDL Cholesterol 65 0 - 99 mg/dL   Total CHOL/HDL Ratio 3    NonHDL 96.94   Hemoglobin A1c  Result Value Ref Range   Hgb A1c MFr Bld 6.2 4.6 - 6.5 %    This visit occurred during the SARS-CoV-2 public health emergency.  Safety protocols were in place, including screening questions prior to the visit, additional usage of staff PPE, and extensive cleaning of exam room while observing appropriate contact time as indicated for disinfecting solutions.   COVID 19 screen:  No recent travel or known exposure to COVID19 The patient denies respiratory symptoms of COVID 19 at this time. The importance of social distancing was discussed today.   Assessment and Plan Problem List Items Addressed This Visit     Chronic insomnia    Stable, chronic.  Continue current medication.    trazodone 1/2 to 1 tab of 50 mg daily at bedtime      Hyperlipidemia associated with type 2 diabetes mellitus (HCC)    Stable, chronic.  Continue current medication.   atorvastatin 40 mg daily      Type II diabetes mellitus, well controlled ([place]) - Primary    Stable, chronic.  Continue current medication.   Metofrmin 500 mg 2 tabs daily Jardiance 100 mg daily      Meds ordered this encounter  Medications   traZODone (DESYREL) 50 MG tablet    Sig: TAKE 1/2 TO 1 TABLET (25-50 MG TOTAL) BY MOUTH AT BEDTIME AS NEEDED FOR SLEEP.    Dispense:  90 tablet    Refill:  1       Eliezer Lofts, MD

## 2021-09-09 NOTE — Assessment & Plan Note (Signed)
Stable, chronic.  Continue current medication.   Metofrmin 500 mg 2 tabs daily Jardiance 100 mg daily 

## 2021-09-09 NOTE — Assessment & Plan Note (Signed)
Stable, chronic.  Continue current medication.    atorvastatin 40 mg daily. 

## 2021-09-09 NOTE — Patient Instructions (Signed)
Work on cardiovascular exercise 150 min per week and strengthening 2-3 days a week.

## 2021-11-30 ENCOUNTER — Ambulatory Visit
Admission: RE | Admit: 2021-11-30 | Discharge: 2021-11-30 | Disposition: A | Discharge status: Home / Self Care | Payer: BC Managed Care – PPO | Source: Ambulatory Visit | Attending: Emergency Medicine | Admitting: Emergency Medicine

## 2021-11-30 VITALS — BP 127/77 | HR 72 | Temp 98.1°F | Resp 18

## 2021-11-30 DIAGNOSIS — S61419A Laceration without foreign body of unspecified hand, initial encounter: Secondary | ICD-10-CM

## 2021-11-30 MED ORDER — TETANUS-DIPHTH-ACELL PERTUSSIS 5-2.5-18.5 LF-MCG/0.5 IM SUSY
0.5000 mL | PREFILLED_SYRINGE | Freq: Once | INTRAMUSCULAR | Status: AC
  Administered 2021-11-30: 0.5 mL via INTRAMUSCULAR

## 2021-11-30 MED ORDER — CEPHALEXIN 500 MG PO CAPS: 1000.0000 mg | ORAL_CAPSULE | Freq: Two times a day (BID) | ORAL | 0 refills | Status: AC

## 2021-11-30 NOTE — Discharge Instructions (Addendum)
Keep this clean and dry for the first 48 to 72 hours.  Please take it easy with your hand.  We will see you in 10 days.  Finish the Keflex.  This is a prophylactic antibiotic.  May take 600 mg of ibuprofen combined 1000 mg of Tylenol together 3-4 times a day as needed for pain.  Return here for any signs of infection. ?

## 2021-11-30 NOTE — ED Provider Notes (Signed)
HPI ? ?SUBJECTIVE: ? ?Joseph Lam is a right-handed 56 y.o. male who presents with a laceration to the dorsal right MCP joint sustained immediately prior to arrival on a clean metal blind.  Patient denies pain, numbness, tingling, weakness of the finger, foreign body sensation.  No aggravating or alleviating factors.  He has not tried anything for this.  He has a past medical history of hyperlipidemia, diabetes.  Last tetanus was in 2013. ? ? ?Past Medical History:  ?Diagnosis Date  ? Allergy   ? Arthritis   ? shoulder  ? Diabetes mellitus ([place])   ? GERD (gastroesophageal reflux disease)   ? Hyperlipidemia   ? PONV (postoperative nausea and vomiting)   ? Substance abuse ([place])   ? ? ?Past Surgical History:  ?Procedure Laterality Date  ? BICEPT TENODESIS Right 10/23/2019  ? Procedure: BICEPS TENODESIS;  Surgeon: Thornton Park, MD;  Location: [facility] ORS;  Service: Orthopedics;  Laterality: Right;  ? COLONOSCOPY    ? HERNIA REPAIR    ? umbilical repair  ? LABRAL REPAIR Right 10/23/2019  ? Procedure: LABRAL REPAIR OPEN;  Surgeon: Thornton Park, MD;  Location: [facility] ORS;  Service: Orthopedics;  Laterality: Right;  ? POLYPECTOMY    ? SHOULDER ARTHROSCOPY WITH OPEN ROTATOR CUFF REPAIR Right 10/23/2019  ? Procedure: SHOULDER ARTHROSCOPY WITH OPEN ROTATOR CUFF REPAIR;  Surgeon: Thornton Park, MD;  Location: [facility] OR[facility];  Service: Orthopedics;  Laterality: Right;  ? SUBACROMIAL DECOMPRESSION Right 10/23/2019  ? Procedure: SUBACROMIAL DECOMPRESSION;  Surgeon: Thornton Park, MD;  Location: [facility] ORS;  Service: Orthopedics;  Laterality: Right;  ? TONSILLECTOMY    ? as a child  ? ? ?Family History  ?Problem Relation Age of Onset  ? Cancer Father 54  ?     pancreatic cancer  ? Pancreatic cancer Father   ? Prostate cancer Father   ? Depression Sister   ? Depression Sister   ? Stroke Mother   ? Colon cancer Neg Hx   ? Colon polyps Neg Hx   ? Esophageal cancer Neg Hx   ? Rectal cancer Neg Hx   ? Stomach cancer Neg Hx   ? ? ?Social  History  ? ?Tobacco Use  ? Smoking status: Never  ? Smokeless tobacco: Never  ?Vaping Use  ? Vaping Use: Never used  ?Substance Use Topics  ? Alcohol use: Not Currently  ?  Alcohol/week: 1.0 standard drink  ?  Types: 1 Cans of beer per week  ?  Comment: 4-6 per month  ? Drug use: Yes  ?  Types: Marijuana  ?  Comment: used yesterday in the evening  ? ? ? ?Current Facility-Administered Medications:  ?  Tdap (BOOSTRIX) injection 0.5 mL, 0.5 mL, Intramuscular, Once, Melynda Ripple, MD ? ?Current Outpatient Medications:  ?  cephALEXin (KEFLEX) 500 MG capsule, Take 2 capsules (1,000 mg total) by mouth 2 (two) times daily for 5 days., Disp: 20 capsule, Rfl: 0 ?  acetaminophen (TYLENOL) 500 MG tablet, Take 1,000 mg by mouth in the morning and at bedtime., Disp: , Rfl:  ?  aspirin 81 MG tablet, Take 81 mg by mouth daily., Disp: , Rfl:  ?  atorvastatin (LIPITOR) 40 MG tablet, TAKE 1 TABLET BY MOUTH EVERY DAY, Disp: 90 tablet, Rfl: 3 ?  Blood Glucose Monitoring Suppl (ONE TOUCH ULTRA 2) w/Device KIT, CHECK BLOOD SUGAR DAILY, Disp: , Rfl: 0 ?  Choline Fenofibrate (FENOFIBRIC ACID) 135 MG CPDR, Take 1 tablet by mouth daily., Disp: 90 capsule, Rfl:  3 ?  gabapentin (NEURONTIN) 300 MG capsule, Take 300 mg by mouth 3 (three) times daily., Disp: , Rfl:  ?  JANUVIA 100 MG tablet, TAKE 1 TABLET BY MOUTH EVERY DAY, Disp: 30 tablet, Rfl: 11 ?  metFORMIN (GLUCOPHAGE) 500 MG tablet, TAKE 2 TABLETS (1,000 MG TOTAL) BY MOUTH AT BEDTIME., Disp: 180 tablet, Rfl: 3 ?  MICROLET LANCETS MISC, Check blood sugar 1-2 times daily and as directed. 250.00, Disp: 100 each, Rfl: 11 ?  Multiple Vitamins-Minerals (MULTIVITAMIN WITH MINERALS) tablet, Take 1 tablet by mouth at bedtime., Disp: , Rfl:  ?  Omega-3 Fatty Acids (FISH OIL) 1000 MG CAPS, Take 1,000 mg by mouth at bedtime. , Disp: , Rfl:  ?  Omeprazole Magnesium (PRILOSEC OTC PO), Take 20 mg by mouth at bedtime. , Disp: , Rfl:  ?  ONETOUCH ULTRA test strip, CHECK BLOOD SUGAR DAILY, Disp: 100  each, Rfl: 3 ?  traZODone (DESYREL) 50 MG tablet, TAKE 1/2 TO 1 TABLET (25-50 MG TOTAL) BY MOUTH AT BEDTIME AS NEEDED FOR SLEEP., Disp: 90 tablet, Rfl: 1 ?  triamcinolone cream (KENALOG) 0.1 %, Apply 1 application topically 2 (two) times daily., Disp: 30 g, Rfl: 0 ? ?No Known Allergies ? ? ?ROS ? ?As noted in HPI.  ? ?Physical Exam ? ?BP 127/77 (BP Location: Left Arm)   Pulse 72   Temp 98.1 ?F (36.7 ?C) (Oral)   Resp 18   SpO2 98%  ? ?Constitutional: Well developed, well nourished, no acute distress ?Eyes:  EOMI, conjunctiva normal bilaterally ?HENT: Normocephalic, atraumatic,mucus membranes moist ?Respiratory: Normal inspiratory effort ?Cardiovascular: Normal rate ?GI: nondistended ?skin: Nontender 2 cm clean linear laceration right MCP joint Extending through the dermis. ? ? ? ? ?Musculoskeletal: Patient able to flex/extend thumb against resistance.  Sensation grossly intact distally.  Cap refill less than 2 seconds. ?Neurologic: Alert & oriented x 3, no focal neuro deficits ?Psychiatric: Speech and behavior appropriate ? ? ?ED Course ? ? ?Medications  ?Tdap (BOOSTRIX) injection 0.5 mL (has no administration in time range)  ? ? ?No orders of the defined types were placed in this encounter. ? ? ?No results found for this or any previous visit (from the past 24 hour(s)). ?No results found. ? ?ED Clinical Impression ? ?1. Laceration of dorsum of hand   ?  ? ?ED Assessment/Plan ? ?Procedure note: Cleaned area with chlorhexidine and tap water, alcohol.  Performed local anesthesia via infiltration with 2 cc of lidocaine 1% with epinephrine with adequate anesthesia.  Again Scrubbed with chlorhexidine and irrigated out with copious amounts of tap water. then explored wound with adequate hemostasis.  No tendon, neurovascular involvement noted.  No foreign body seen.  Placed 4 5-0 interrupted Prolene sutures with close approximation of wound edges.  Then placed dressing and in a splint.  Patient tolerated procedure  well. ? ?Tetanus updated. ? ?Home with Keflex as prophylaxis since patient is a diabetic and states that he gets frequent infections.  Tylenol/ibuprofen PRN.  States he does not need a prescription for this.  Return here in 10 days for suture removal, sooner for any signs of infection. ? ?Discussed  MDM, treatment plan, and plan for follow-up with patient. patient agrees with plan.  ? ?Meds ordered this encounter  ?Medications  ? Tdap (BOOSTRIX) injection 0.5 mL  ? cephALEXin (KEFLEX) 500 MG capsule  ?  Sig: Take 2 capsules (1,000 mg total) by mouth 2 (two) times daily for 5 days.  ?  Dispense:  20 capsule  ?  Refill:  0  ? ? ? ? ?*This clinic note was created using [employer]. Therefore, there may be occasional mistakes despite careful proofreading. ? ?? ? ?  ?Melynda Ripple, MD ?11/30/21 1751 ? ?

## 2021-11-30 NOTE — ED Triage Notes (Addendum)
Pt presents with a laceration on thumb. He was hanging a blind and it fell the corner of the blind cut has thumb.  ? ?Last Tetanus 04/12/2012. ?

## 2021-12-21 ENCOUNTER — Other Ambulatory Visit: Payer: Self-pay | Admitting: Family Medicine

## 2022-01-08 ENCOUNTER — Emergency Department (HOSPITAL_BASED_OUTPATIENT_CLINIC_OR_DEPARTMENT_OTHER): Payer: BC Managed Care – PPO

## 2022-01-08 ENCOUNTER — Emergency Department (HOSPITAL_BASED_OUTPATIENT_CLINIC_OR_DEPARTMENT_OTHER)
Admission: EM | Admit: 2022-01-08 | Discharge: 2022-01-08 | Disposition: A | Discharge status: Home / Self Care | Payer: BC Managed Care – PPO | Attending: Student | Admitting: Student

## 2022-01-08 ENCOUNTER — Encounter (HOSPITAL_BASED_OUTPATIENT_CLINIC_OR_DEPARTMENT_OTHER): Payer: Self-pay | Admitting: Emergency Medicine

## 2022-01-08 ENCOUNTER — Other Ambulatory Visit: Payer: Self-pay

## 2022-01-08 ENCOUNTER — Ambulatory Visit: Admit: 2022-01-08 | Disposition: A | Discharge status: Other Acute Inpatient Hospital | Payer: BC Managed Care – PPO

## 2022-01-08 DIAGNOSIS — S6992XA Unspecified injury of left wrist, hand and finger(s), initial encounter: Secondary | ICD-10-CM | POA: Diagnosis present

## 2022-01-08 DIAGNOSIS — E119 Type 2 diabetes mellitus without complications: Secondary | ICD-10-CM | POA: Diagnosis not present

## 2022-01-08 DIAGNOSIS — W290XXA Contact with powered kitchen appliance, initial encounter: Secondary | ICD-10-CM | POA: Diagnosis not present

## 2022-01-08 DIAGNOSIS — S61412A Laceration without foreign body of left hand, initial encounter: Secondary | ICD-10-CM | POA: Insufficient documentation

## 2022-01-08 DIAGNOSIS — Z7982 Long term (current) use of aspirin: Secondary | ICD-10-CM | POA: Insufficient documentation

## 2022-01-08 DIAGNOSIS — Z7984 Long term (current) use of oral hypoglycemic drugs: Secondary | ICD-10-CM | POA: Insufficient documentation

## 2022-01-08 MED ORDER — LIDOCAINE HCL (PF) 1 % IJ SOLN
10.0000 mL | Freq: Once | INTRAMUSCULAR | Status: AC
  Administered 2022-01-08: 10 mL
  Filled 2022-01-08: qty 10

## 2022-01-08 NOTE — ED Provider Notes (Signed)
[facility] EMERGENCY DEPT Provider Note   CSN: [id number] Arrival date & time: 01/08/22  1656     History  No chief complaint on file.   Joseph Lam is a 56 y.o. male with history of arthritis, diabetes, hyperlipidemia presents the emergency department for evaluation of left hand laceration.  Patient reports around 2 hours prior to arrival the patient was using a grinder outside when he heard something possibly break and it cut the dorsum of his left hand.  He reports some numbness near his knuckles otherwise has full range of motion of his hand and wrist.  He denies any other injury.  He no known drug allergies.  Up-to-date on his tetanus last month.  Patient initially went to urgent care who sent him over here for evaluation as they did not have an x-ray.  HPI     Home Medications Prior to Admission medications   Medication Sig Start Date End Date Taking? Authorizing Provider  acetaminophen (TYLENOL) 500 MG tablet Take 1,000 mg by mouth in the morning and at bedtime.    [provider]  aspirin 81 MG tablet Take 81 mg by mouth daily.    [provider]  atorvastatin (LIPITOR) 40 MG tablet TAKE 1 TABLET BY MOUTH EVERY DAY 03/18/21   Bedsole, Amy E, MD  Blood Glucose Monitoring Suppl (ONE TOUCH ULTRA 2) w/Device KIT CHECK BLOOD SUGAR DAILY 09/12/17   [provider]  Choline Fenofibrate (FENOFIBRIC ACID) 135 MG CPDR Take 1 tablet by mouth daily. 09/02/19   Bedsole, Amy E, MD  gabapentin (NEURONTIN) 300 MG capsule Take 300 mg by mouth 3 (three) times daily. 11/21/21   [provider]  JANUVIA 100 MG tablet TAKE 1 TABLET BY MOUTH EVERY DAY 12/21/21   Bedsole, Amy E, MD  metFORMIN (GLUCOPHAGE) 500 MG tablet TAKE 2 TABLETS (1,000 MG TOTAL) BY MOUTH AT BEDTIME. 04/18/21   Bedsole, Amy E, MD  MICROLET LANCETS MISC Check blood sugar 1-2 times daily and as directed. 250.00 05/02/12   Bedsole, Amy E, MD  Multiple Vitamins-Minerals (MULTIVITAMIN WITH  MINERALS) tablet Take 1 tablet by mouth at bedtime.    [provider]  Omega-3 Fatty Acids (FISH OIL) 1000 MG CAPS Take 1,000 mg by mouth at bedtime.     [provider]  Omeprazole Magnesium (PRILOSEC OTC PO) Take 20 mg by mouth at bedtime.     [provider]  ONETOUCH ULTRA test strip CHECK BLOOD SUGAR DAILY 12/16/18   Bedsole, Amy E, MD  traZODone (DESYREL) 50 MG tablet TAKE 1/2 TO 1 TABLET (25-50 MG TOTAL) BY MOUTH AT BEDTIME AS NEEDED FOR SLEEP. 09/09/21   Bedsole, Amy E, MD  triamcinolone cream (KENALOG) 0.1 % Apply 1 application topically 2 (two) times daily. 03/24/20   Moshe Cipro, NP      Allergies    Patient has no known allergies.    Review of Systems   Review of Systems  Skin:  Positive for wound.  Neurological:  Positive for numbness. Negative for weakness.    Physical Exam Updated Vital Signs BP (!) 152/96 (BP Location: Right Arm)   Pulse 84   Temp 97.8 F (36.6 C)   Resp 20   SpO2 99%  Physical Exam Vitals and nursing note reviewed.  Constitutional:      Appearance: Normal appearance.  Eyes:     General: No scleral icterus. Pulmonary:     Effort: Pulmonary effort is normal. No respiratory distress.  Skin:  General: Skin is dry.     Findings: No rash.     Comments: Approximately 3 cm laceration noted to the dorsum of the left hand.  Patient still has full range of motion of his fingers and wrist.  Finger thumb opposition intact.  Good grip strength.  Patient reports he has some decree sensation in his index and middle finger MCP joints.  Cap refill intact.  Compartments are soft.  Neurological:     General: No focal deficit present.     Mental Status: He is alert. Mental status is at baseline.  Psychiatric:        Mood and Affect: Mood normal.     ED Results / Procedures / Treatments   Labs (all labs ordered are listed, but only abnormal results are displayed) Labs Reviewed - No data to  display  EKG None  Radiology DG Hand Complete Left  Result Date: 01/08/2022 CLINICAL DATA:  Hand laceration EXAM: LEFT HAND - COMPLETE 3+ VIEW COMPARISON:  None Available. FINDINGS: There is no evidence of fracture or dislocation. There is no evidence of arthropathy or other focal bone abnormality. Soft tissues are unremarkable. No radiopaque foreign bodies. IMPRESSION: No fracture or foreign body. Electronically Signed   By: Charlett Nose M.D.   On: 01/08/2022 18:13    Procedures Procedures   Medications Ordered in ED Medications - No data to display  ED Course/ Medical Decision Making/ A&P                           Medical Decision Making Amount and/or Complexity of Data Reviewed Radiology: ordered.  Risk Prescription drug management.  56 year old male presents emerged from for evaluation of hand wound on the dorsum of the left hand.  Vital signs show mild increase in blood pressure otherwise are stable.  Physical exam is pertinent for approximately 3 cm laceration to the dorsum of the left hand.  Patient does have some subjective sensation differences on his index and third finger MCP joints.  Full range of motion.  Normal finger-nose opposition.  Grip strength.  Compartments are soft.  Cap Refill brisk.  Radial pulses intact.  Procedure and clean out performed by my attending.  Please see his note.  I do not the patient needs any hand consultation as the patient only has some mild sensory deficits but has full range of motion of his hand.  His tetanus is already up-to-date.  Wound did not appear visibly contaminated.  My attending does not see any need for any antibiotic at this time.  Strict return precautions and red blood symptoms were discussed the patient.  Patient discharged in good condition.  Final Clinical Impression(s) / ED Diagnoses Final diagnoses:  Laceration of left hand without foreign body, initial encounter    Rx / DC Orders ED Discharge Orders     None          Joseph Rich, PA-C 01/09/22 1112    Kommor, [place], MD 01/09/22 1209

## 2022-02-23 ENCOUNTER — Telehealth: Payer: Self-pay | Admitting: Family Medicine

## 2022-02-23 DIAGNOSIS — Z125 Encounter for screening for malignant neoplasm of prostate: Secondary | ICD-10-CM

## 2022-02-23 DIAGNOSIS — E119 Type 2 diabetes mellitus without complications: Secondary | ICD-10-CM

## 2022-02-23 NOTE — Telephone Encounter (Signed)
-----   Message from Ellamae Sia sent at 02/14/2022  4:01 PM EDT ----- Regarding: Lab orders for Thursday, 8.17.23 Patient is scheduled for CPX labs, please order future labs, Thanks , Karna Christmas

## 2022-03-02 ENCOUNTER — Other Ambulatory Visit: Payer: BC Managed Care – PPO

## 2022-03-08 ENCOUNTER — Other Ambulatory Visit (INDEPENDENT_AMBULATORY_CARE_PROVIDER_SITE_OTHER): Payer: BC Managed Care – PPO

## 2022-03-08 DIAGNOSIS — E119 Type 2 diabetes mellitus without complications: Secondary | ICD-10-CM | POA: Diagnosis not present

## 2022-03-08 DIAGNOSIS — Z125 Encounter for screening for malignant neoplasm of prostate: Secondary | ICD-10-CM | POA: Diagnosis not present

## 2022-03-08 LAB — MICROALBUMIN / CREATININE URINE RATIO
Creatinine,U: 105.2 mg/dL
Microalb Creat Ratio: 1.6 mg/g (ref 0.0–30.0)
Microalb, Ur: 1.7 mg/dL (ref 0.0–1.9)

## 2022-03-08 LAB — COMPREHENSIVE METABOLIC PANEL
ALT: 10 U/L (ref 0–53)
AST: 12 U/L (ref 0–37)
Albumin: 4.2 g/dL (ref 3.5–5.2)
Alkaline Phosphatase: 45 U/L (ref 39–117)
BUN: 20 mg/dL (ref 6–23)
CO2: 33 mEq/L — ABNORMAL HIGH (ref 19–32)
Calcium: 9.6 mg/dL (ref 8.4–10.5)
Chloride: 102 mEq/L (ref 96–112)
Creatinine, Ser: 0.91 mg/dL (ref 0.40–1.50)
GFR: 93.21 mL/min (ref >60.00)
Glucose, Bld: 106 mg/dL — ABNORMAL HIGH (ref 70–99)
Potassium: 4 mEq/L (ref 3.5–5.1)
Sodium: 141 mEq/L (ref 135–145)
Total Bilirubin: 0.6 mg/dL (ref 0.2–1.2)
Total Protein: 6.4 g/dL (ref 6.0–8.3)

## 2022-03-08 LAB — LIPID PANEL
Cholesterol: 220 mg/dL — ABNORMAL HIGH (ref 0–200)
HDL: 38.2 mg/dL — ABNORMAL LOW (ref >39.00)
NonHDL: 181.83
Total CHOL/HDL Ratio: 6
Triglycerides: 238 mg/dL — ABNORMAL HIGH (ref 0.0–149.0)
VLDL: 47.6 mg/dL — ABNORMAL HIGH (ref 0.0–40.0)

## 2022-03-08 LAB — LDL CHOLESTEROL, DIRECT: Direct LDL: 145 mg/dL

## 2022-03-08 LAB — PSA: PSA: 2.62 ng/mL (ref 0.10–4.00)

## 2022-03-08 LAB — HEMOGLOBIN A1C: Hgb A1c MFr Bld: 6.3 % (ref 4.6–6.5)

## 2022-03-08 NOTE — Progress Notes (Signed)
No critical labs need to be addressed urgently. We will discuss labs in detail at upcoming office visit.   

## 2022-03-09 ENCOUNTER — Encounter: Payer: BC Managed Care – PPO | Admitting: Family Medicine

## 2022-03-10 ENCOUNTER — Encounter: Payer: Self-pay | Admitting: Family Medicine

## 2022-03-10 ENCOUNTER — Other Ambulatory Visit: Payer: BC Managed Care – PPO

## 2022-03-10 ENCOUNTER — Ambulatory Visit (INDEPENDENT_AMBULATORY_CARE_PROVIDER_SITE_OTHER): Payer: BC Managed Care – PPO | Admitting: Family Medicine

## 2022-03-10 VITALS — BP 120/80 | HR 77 | Temp 98.4°F | Ht 68.5 in | Wt 153.0 lb

## 2022-03-10 DIAGNOSIS — Z0000 Encounter for general adult medical examination without abnormal findings: Secondary | ICD-10-CM

## 2022-03-10 DIAGNOSIS — Z23 Encounter for immunization: Secondary | ICD-10-CM

## 2022-03-10 DIAGNOSIS — R972 Elevated prostate specific antigen [PSA]: Secondary | ICD-10-CM

## 2022-03-10 DIAGNOSIS — E1169 Type 2 diabetes mellitus with other specified complication: Secondary | ICD-10-CM

## 2022-03-10 DIAGNOSIS — E119 Type 2 diabetes mellitus without complications: Secondary | ICD-10-CM

## 2022-03-10 DIAGNOSIS — F5104 Psychophysiologic insomnia: Secondary | ICD-10-CM | POA: Diagnosis not present

## 2022-03-10 DIAGNOSIS — E785 Hyperlipidemia, unspecified: Secondary | ICD-10-CM

## 2022-03-10 NOTE — Progress Notes (Addendum)
Patient ID: Joseph Lam, male    DOB: 12/16/1965, 56 y.o.   MRN: [id number]  This visit was conducted in person.  BP 120/80   Pulse 77   Temp 98.4 F (36.9 C) (Oral)   Ht 5' 8.5" (1.74 m)   Wt 153 lb (69.4 kg)   SpO2 98%   BMI 22.93 kg/m    CC:  Chief Complaint  Patient presents with  . Annual Exam    Subjective:   HPI: Joseph Lam is a 56 y.o. male presenting on 03/10/2022 for Annual Exam    Elevated Cholesterol: LDL  was at goal on atorvastatin 40 mg daily.. he stopped it for a few week, but did not feel any better. Lab Results  Component Value Date   CHOL 220 (H) 03/08/2022   HDL 38.20 (L) 03/08/2022   LDLCALC 65 09/07/2021   LDLDIRECT 145.0 03/08/2022   TRIG 238.0 (H) 03/08/2022   CHOLHDL 6 03/08/2022  Using medications without problems: Muscle aches:  Diet compliance: good Exercise: poor Other complaints:  Diabetes: At goal on metformin 1000 mg p.o. daily ( He had less stomach issue on  XR) and Januvia 100 mg p.o. daily Lab Results  Component Value Date   HGBA1C 6.3 03/08/2022  Using medications without difficulties: Hypoglycemic episodes: Hyperglycemic episodes: Feet problems: no ulcers Blood Sugars averaging: eye exam within last year: due  Mild intermittent asthma: Rare issue   He is now seeing a psychiatrist for MDD and possible ADD. Chronic insomnia: Well-controlled on trazodone 50 mg p.o. daily.     Wt Readings from Last 3 Encounters:  03/10/22 153 lb (69.4 kg)  09/09/21 154 lb 5 oz (70 kg)  03/01/21 153 lb (69.4 kg)     Relevant past medical, surgical, family and social history reviewed and updated as indicated. Interim medical history since our last visit reviewed. Allergies and medications reviewed and updated. Outpatient Medications Prior to Visit  Medication Sig Dispense Refill  . acetaminophen (TYLENOL) 500 MG tablet Take 1,000 mg by mouth in the morning and at bedtime.    Marland Kitchen aspirin 81 MG tablet Take 81 mg by mouth daily.     Marland Kitchen atorvastatin (LIPITOR) 40 MG tablet TAKE 1 TABLET BY MOUTH EVERY DAY 90 tablet 3  . Blood Glucose Monitoring Suppl (ONE TOUCH ULTRA 2) w/Device KIT CHECK BLOOD SUGAR DAILY  0  . Choline Fenofibrate (FENOFIBRIC ACID) 135 MG CPDR Take 1 tablet by mouth daily. 90 capsule 3  . gabapentin (NEURONTIN) 300 MG capsule Take 300 mg by mouth 3 (three) times daily.    Marland Kitchen JANUVIA 100 MG tablet TAKE 1 TABLET BY MOUTH EVERY DAY 30 tablet 2  . meloxicam (MOBIC) 15 MG tablet Take 15 mg by mouth daily.    . metFORMIN (GLUCOPHAGE) 500 MG tablet TAKE 2 TABLETS (1,000 MG TOTAL) BY MOUTH AT BEDTIME. 180 tablet 3  . MICROLET LANCETS MISC Check blood sugar 1-2 times daily and as directed. 250.00 100 each 11  . Multiple Vitamins-Minerals (MULTIVITAMIN WITH MINERALS) tablet Take 1 tablet by mouth at bedtime.    . Omeprazole Magnesium (PRILOSEC OTC PO) Take 20 mg by mouth at bedtime.     Glory Rosebush ULTRA test strip CHECK BLOOD SUGAR DAILY 100 each 3  . traZODone (DESYREL) 50 MG tablet TAKE 1/2 TO 1 TABLET (25-50 MG TOTAL) BY MOUTH AT BEDTIME AS NEEDED FOR SLEEP. 90 tablet 1  . Omega-3 Fatty Acids (FISH OIL) 1000 MG CAPS Take 1,000 mg  by mouth at bedtime.     . triamcinolone cream (KENALOG) 0.1 % Apply 1 application topically 2 (two) times daily. 30 g 0   No facility-administered medications prior to visit.     Per HPI unless specifically indicated in ROS section below Review of Systems  Constitutional:  Negative for fatigue and fever.  HENT:  Negative for ear pain.   Eyes:  Negative for pain.  Respiratory:  Negative for cough and shortness of breath.   Cardiovascular:  Negative for chest pain, palpitations and leg swelling.  Gastrointestinal:  Negative for abdominal pain.  Genitourinary:  Negative for dysuria.  Musculoskeletal:  Negative for arthralgias.  Neurological:  Negative for syncope, light-headedness and headaches.  Psychiatric/Behavioral:  Negative for dysphoric mood.    Objective:  BP 120/80    Pulse 77   Temp 98.4 F (36.9 C) (Oral)   Ht 5' 8.5" (1.74 m)   Wt 153 lb (69.4 kg)   SpO2 98%   BMI 22.93 kg/m   Wt Readings from Last 3 Encounters:  03/10/22 153 lb (69.4 kg)  09/09/21 154 lb 5 oz (70 kg)  03/01/21 153 lb (69.4 kg)      Physical Exam Constitutional:      General: He is not in acute distress.    Appearance: Normal appearance. He is well-developed. He is not ill-appearing or toxic-appearing.  HENT:     Head: Normocephalic and atraumatic.     Right Ear: Hearing, tympanic membrane, ear canal and external ear normal.     Left Ear: Hearing, tympanic membrane, ear canal and external ear normal.     Nose: Nose normal.     Mouth/Throat:     Pharynx: Uvula midline.  Eyes:     General: Lids are normal. Lids are everted, no foreign bodies appreciated.     Conjunctiva/sclera: Conjunctivae normal.     Pupils: Pupils are equal, round, and reactive to light.  Neck:     Thyroid: No thyroid mass or thyromegaly.     Vascular: No carotid bruit.     Trachea: Trachea and phonation normal.  Cardiovascular:     Rate and Rhythm: Normal rate and regular rhythm.     Pulses: Normal pulses.     Heart sounds: S1 normal and S2 normal. No murmur heard.    No gallop.  Pulmonary:     Breath sounds: Normal breath sounds. No wheezing, rhonchi or rales.  Abdominal:     General: Bowel sounds are normal.     Palpations: Abdomen is soft.     Tenderness: There is no abdominal tenderness. There is no guarding or rebound.     Hernia: No hernia is present.  Musculoskeletal:     Cervical back: Normal range of motion and neck supple.  Lymphadenopathy:     Cervical: No cervical adenopathy.  Skin:    General: Skin is warm and dry.     Findings: No rash.  Neurological:     Mental Status: He is alert.     Cranial Nerves: No cranial nerve deficit.     Sensory: No sensory deficit.     Gait: Gait normal.     Deep Tendon Reflexes: Reflexes are normal and symmetric.  Psychiatric:         Speech: Speech normal.        Behavior: Behavior normal.        Judgment: Judgment normal.      Results for orders placed or performed in visit on 03/08/22  PSA  Result Value Ref Range   PSA 2.62 0.10 - 4.00 ng/mL  Hemoglobin A1c  Result Value Ref Range   Hgb A1c MFr Bld 6.3 4.6 - 6.5 %  Lipid panel  Result Value Ref Range   Cholesterol 220 (H) 0 - 200 mg/dL   Triglycerides 238.0 (H) 0.0 - 149.0 mg/dL   HDL 38.20 (L) >39.00 mg/dL   VLDL 47.6 (H) 0.0 - 40.0 mg/dL   Total CHOL/HDL Ratio 6    NonHDL 181.83   Comprehensive metabolic panel  Result Value Ref Range   Sodium 141 135 - 145 mEq/L   Potassium 4.0 3.5 - 5.1 mEq/L   Chloride 102 96 - 112 mEq/L   CO2 33 (H) 19 - 32 mEq/L   Glucose, Bld 106 (H) 70 - 99 mg/dL   BUN 20 6 - 23 mg/dL   Creatinine, Ser 0.91 0.40 - 1.50 mg/dL   Total Bilirubin 0.6 0.2 - 1.2 mg/dL   Alkaline Phosphatase 45 39 - 117 U/L   AST 12 0 - 37 U/L   ALT 10 0 - 53 U/L   Total Protein 6.4 6.0 - 8.3 g/dL   Albumin 4.2 3.5 - 5.2 g/dL   GFR 93.21 >60.00 mL/min   Calcium 9.6 8.4 - 10.5 mg/dL  Microalbumin / creatinine urine ratio  Result Value Ref Range   Microalb, Ur 1.7 0.0 - 1.9 mg/dL   Creatinine,U 105.2 mg/dL   Microalb Creat Ratio 1.6 0.0 - 30.0 mg/g  LDL cholesterol, direct  Result Value Ref Range   Direct LDL 145.0 mg/dL     COVID 19 screen:  No recent travel or known exposure to COVID19 The patient denies respiratory symptoms of COVID 19 at this time. The importance of social distancing was discussed today.   Assessment and Plan The patient's preventative maintenance and recommended screening tests for an annual wellness exam were reviewed in full today. Brought up to date unless services declined.  Counselled on the importance of diet, exercise, and its role in overall health and mortality. The patient's FH and SH was reviewed, including their home life, tobacco status, and drug and alcohol status.    Prostate cancer  in Dad  At age  67.   Lab Results  Component Value Date   PSA 2.62 03/08/2022   PSA 1.71 02/24/2021   PSA 1.83 02/20/2020   Vaccines:  Uptodate, COVID 19 x 2, PNA, Tdap, consider shingrix. GIven flu vaccine today. Colon cancer screening:  03/2020 Pyrtle repeat in 5 years Nonsmoker  HIV: refused. No ETOH.drug use Hep C: done EYE exam:  Due   Eliezer Lofts, MD

## 2022-03-10 NOTE — Patient Instructions (Addendum)
Set up yearly eye exam for diabetes and have the opthalmologist send [place] a copy of the evaluation for the chart.  Make sure to take the metformin 500 mg short acting twice daily... if still issue we can sto metformin altogether. Stop meloxicam if you can  as it can irritate stomach. Restart the atorvastatin.  Can start B12 1000 mcg daily or B complex.  Vit D 400 IU 1-2 times daily.

## 2022-03-15 ENCOUNTER — Other Ambulatory Visit: Payer: Self-pay | Admitting: Family Medicine

## 2022-03-16 ENCOUNTER — Encounter: Payer: BC Managed Care – PPO | Admitting: Family Medicine

## 2022-03-28 ENCOUNTER — Other Ambulatory Visit: Payer: Self-pay | Admitting: Family Medicine

## 2022-06-01 ENCOUNTER — Other Ambulatory Visit: Payer: Self-pay | Admitting: Family Medicine

## 2022-06-12 ENCOUNTER — Other Ambulatory Visit: Payer: BC Managed Care – PPO

## 2022-06-20 ENCOUNTER — Other Ambulatory Visit: Payer: Self-pay | Admitting: Family Medicine

## 2022-06-20 MED ORDER — GABAPENTIN 300 MG PO CAPS: 300.0000 mg | ORAL_CAPSULE | Freq: Three times a day (TID) | ORAL | 0 refills | Status: DC

## 2022-06-20 NOTE — Telephone Encounter (Signed)
Last office visit 03/10/22 for CPE.  Last refilled:  listed as historical medication.  Next Appt: 09/12/22 for DM.

## 2022-06-20 NOTE — Telephone Encounter (Signed)
  Encourage patient to contact the pharmacy for refills or they can request refills through [facility]  Did the patient contact the pharmacy:  yes   LAST APPOINTMENT DATE:  Please schedule appointment if longer than 1 year  NEXT APPOINTMENT DATE:09/12/2022  MEDICATION:gabapentin (NEURONTIN) 300 MG capsule   Is the patient out of medication? yes  If not, how much is left?  Is this a 90 day supply: yes  PHARMACY: [id number] [address]: [phone] Fax: [phone]     Let patient know to contact pharmacy at the end of the day to make sure medication is ready.  Please notify patient to allow 48-72 hours to process

## 2022-07-19 ENCOUNTER — Ambulatory Visit
Admission: RE | Admit: 2022-07-19 | Discharge: 2022-07-19 | Disposition: A | Discharge status: Home / Self Care | Payer: BC Managed Care – PPO | Source: Ambulatory Visit | Attending: Emergency Medicine | Admitting: Emergency Medicine

## 2022-07-19 VITALS — BP 142/85 | HR 81 | Temp 98.3°F | Resp 18 | Ht 68.5 in | Wt 155.0 lb

## 2022-07-19 DIAGNOSIS — B029 Zoster without complications: Secondary | ICD-10-CM | POA: Diagnosis not present

## 2022-07-19 MED ORDER — VALACYCLOVIR HCL 1 G PO TABS
1000.0000 mg | ORAL_TABLET | Freq: Three times a day (TID) | ORAL | 0 refills | Status: DC
Start: 2022-07-19 — End: 2023-06-26

## 2022-07-19 NOTE — ED Triage Notes (Signed)
Patient to Urgent Care with complaints of rash present to back (raised), right shoulder, left side of ribs. Present for two days.   Describes rash as itchy, painful when scratched. Also describes some topical numbness.   Denies any new food/ product usage.

## 2022-07-19 NOTE — Discharge Instructions (Addendum)
Take the valacyclovir as directed for shingles.  Follow up with your primary care provider.

## 2022-07-19 NOTE — ED Provider Notes (Signed)
Joseph Lam    CSN: [id number] Arrival date & time: 07/19/22  1627      History   Chief Complaint Chief Complaint  Patient presents with   Rash    Rash in multiple places,itchy,a little painful if scratched, some topical numbness. - Entered by patient    HPI Joseph Lam is a 57 y.o. male.  Patient presents with a rash on his left mid back x 2 days.  The rash is pruritic and mildly painful when scratched.  He has an area of itching and numbness on his left chest and on his right shoulder but no rash in these areas.  No new products, foods, medications.  No fever, sore throat, cough, or other symptoms.  No treatment at home.  His medical history includes diabetes.  The history is provided by the patient and medical records.    Past Medical History:  Diagnosis Date   Allergy    Arthritis    shoulder   Diabetes mellitus ([place])    GERD (gastroesophageal reflux disease)    Hyperlipidemia    PONV (postoperative nausea and vomiting)    Substance abuse [facility])     Patient Active Problem List   Diagnosis Date Noted   Grief reaction 08/31/2020   Chronic insomnia 02/18/2019   Rosacea 01/18/2018   Myopia with astigmatism and presbyopia, bilateral 12/14/2017   Hyperlipidemia associated with type 2 diabetes mellitus ([place]) 11/07/2012   GERD (gastroesophageal reflux disease) 04/12/2012   Type II diabetes mellitus, well controlled ([place]) 02/06/2008   COCAINE ABUSE, IN REMISSION 02/05/2008   ALLERGIC RHINITIS 02/05/2008   Mild intermittent asthma 02/05/2008    Past Surgical History:  Procedure Laterality Date   BICEPT TENODESIS Right 10/23/2019   Procedure: BICEPS TENODESIS;  Surgeon: Thornton Park, MD;  Location: [facility] ORS;  Service: Orthopedics;  Laterality: Right;   COLONOSCOPY     HERNIA REPAIR     umbilical repair   LABRAL REPAIR Right 10/23/2019   Procedure: LABRAL REPAIR OPEN;  Surgeon: Thornton Park, MD;  Location: [facility] ORS;  Service: Orthopedics;  Laterality:  Right;   POLYPECTOMY     SHOULDER ARTHROSCOPY WITH OPEN ROTATOR CUFF REPAIR Right 10/23/2019   Procedure: SHOULDER ARTHROSCOPY WITH OPEN ROTATOR CUFF REPAIR;  Surgeon: Thornton Park, MD;  Location: [facility] ORS;  Service: Orthopedics;  Laterality: Right;   SUBACROMIAL DECOMPRESSION Right 10/23/2019   Procedure: SUBACROMIAL DECOMPRESSION;  Surgeon: Thornton Park, MD;  Location: [facility] ORS;  Service: Orthopedics;  Laterality: Right;   TONSILLECTOMY     as a child       Home Medications    Prior to Admission medications   Medication Sig Start Date End Date Taking? Authorizing Provider  valACYclovir (VALTREX) 1000 MG tablet Take 1 tablet (1,000 mg total) by mouth 3 (three) times daily. 07/19/22  Yes Sharion Balloon, NP  acetaminophen (TYLENOL) 500 MG tablet Take 1,000 mg by mouth in the morning and at bedtime.    [provider]  aspirin 81 MG tablet Take 81 mg by mouth daily.    [provider]  atorvastatin (LIPITOR) 40 MG tablet TAKE 1 TABLET BY MOUTH EVERY DAY 03/15/22   Bedsole, Amy E, MD  Blood Glucose Monitoring Suppl (ONE TOUCH ULTRA 2) w/Device KIT CHECK BLOOD SUGAR DAILY 09/12/17   [provider]  Choline Fenofibrate (FENOFIBRIC ACID) 135 MG CPDR Take 1 tablet by mouth daily. 09/02/19   Bedsole, Amy E, MD  gabapentin (NEURONTIN) 300 MG capsule Take 1 capsule (300  mg total) by mouth 3 (three) times daily. 06/20/22   Bedsole, Amy E, MD  JANUVIA 100 MG tablet TAKE 1 TABLET BY MOUTH EVERY DAY 03/28/22   Bedsole, Amy E, MD  meloxicam (MOBIC) 15 MG tablet Take 15 mg by mouth daily.    [provider]  metFORMIN (GLUCOPHAGE) 500 MG tablet TAKE 2 TABLETS (1,000 MG TOTAL) BY MOUTH AT BEDTIME. 06/02/22   Bedsole, Amy E, MD  MICROLET LANCETS MISC Check blood sugar 1-2 times daily and as directed. 250.00 05/02/12   Bedsole, Amy E, MD  Multiple Vitamins-Minerals (MULTIVITAMIN WITH MINERALS) tablet Take 1 tablet by mouth at bedtime.    [provider]  Omeprazole  Magnesium (PRILOSEC OTC PO) Take 20 mg by mouth at bedtime.     [provider]  ONETOUCH ULTRA test strip CHECK BLOOD SUGAR DAILY 12/16/18   Bedsole, Amy E, MD  traZODone (DESYREL) 50 MG tablet TAKE 1/2 TO 1 TABLET (25-50 MG TOTAL) BY MOUTH AT BEDTIME AS NEEDED FOR SLEEP. 03/15/22   Jinny Sanders, MD    Family History Family History  Problem Relation Age of Onset   Cancer Father 51       pancreatic cancer   Pancreatic cancer Father    Prostate cancer Father    Depression Sister    Depression Sister    Stroke Mother    Colon cancer Neg Hx    Colon polyps Neg Hx    Esophageal cancer Neg Hx    Rectal cancer Neg Hx    Stomach cancer Neg Hx     Social History Social History   Tobacco Use   Smoking status: Never   Smokeless tobacco: Never  Vaping Use   Vaping Use: Never used  Substance Use Topics   Alcohol use: Not Currently    Alcohol/week: 1.0 standard drink of alcohol    Types: 1 Cans of beer per week    Comment: 4-6 per month   Drug use: Yes    Types: Marijuana    Comment: used yesterday in the evening     Allergies   Patient has no known allergies.   Review of Systems Review of Systems  Constitutional:  Negative for chills and fever.  HENT:  Negative for ear pain and sore throat.   Respiratory:  Negative for cough and shortness of breath.   Cardiovascular:  Negative for chest pain and palpitations.  Skin:  Positive for rash. Negative for color change.  All other systems reviewed and are negative.    Physical Exam Triage Vital Signs ED Triage Vitals  Enc Vitals Group     BP      Pulse      Resp      Temp      Temp src      SpO2      Weight      Height      Head Circumference      Peak Flow      Pain Score      Pain Loc      Pain Edu?      Excl. in [place]?    No data found.  Updated Vital Signs BP (!) 142/85   Pulse 81   Temp 98.3 F (36.8 C)   Resp 18   Ht 5' 8.5" (1.74 m)   Wt 155 lb (70.3 kg)   SpO2 96%   BMI 23.22 kg/m    Visual Acuity Right Eye Distance:   Left  Eye Distance:   Bilateral Distance:    Right Eye Near:   Left Eye Near:    Bilateral Near:     Physical Exam Vitals and nursing note reviewed.  Constitutional:      General: He is not in acute distress.    Appearance: Normal appearance. He is well-developed. He is not ill-appearing.  HENT:     Mouth/Throat:     Mouth: Mucous membranes are moist.  Cardiovascular:     Rate and Rhythm: Normal rate and regular rhythm.     Heart sounds: Normal heart sounds.  Pulmonary:     Effort: Pulmonary effort is normal. No respiratory distress.     Breath sounds: Normal breath sounds.  Musculoskeletal:     Cervical back: Neck supple.  Skin:    General: Skin is warm and dry.     Findings: Rash present.     Comments: Rash on left back.  No rash on chest or shoulder.  See picture.   Neurological:     Mental Status: He is alert.  Psychiatric:        Mood and Affect: Mood normal.        Behavior: Behavior normal.      UC Treatments / Results  Labs (all labs ordered are listed, but only abnormal results are displayed) Labs Reviewed - No data to display  EKG   Radiology No results found.  Procedures Procedures (including critical care time)  Medications Ordered in UC Medications - No data to display  Initial Impression / Assessment and Plan / UC Course  I have reviewed the triage vital signs and the nursing notes.  Pertinent labs & imaging results that were available during my care of the patient were reviewed by me and considered in my medical decision making (see chart for details).   Herpes zoster.  The rash appears to be shingles.  Although he has had itching and superficial numbness of his skin elsewhere, he has no other rash.  Treating today with valacyclovir.  Education provided on shingles.  Instructed patient to follow-up with his PCP.  He agrees to plan of care.   Final Clinical Impressions(s) / UC Diagnoses   Final  diagnoses:  Herpes zoster without complication     Discharge Instructions      Take the valacyclovir as directed for shingles.  Follow up with your primary care provider.        ED Prescriptions     Medication Sig Dispense Auth. Provider   valACYclovir (VALTREX) 1000 MG tablet Take 1 tablet (1,000 mg total) by mouth 3 (three) times daily. 21 tablet Sharion Balloon, NP      PDMP not reviewed this encounter.   Sharion Balloon, NP 07/19/22 1723

## 2022-09-05 ENCOUNTER — Telehealth: Payer: Self-pay | Admitting: Family Medicine

## 2022-09-05 ENCOUNTER — Other Ambulatory Visit: Payer: BC Managed Care – PPO

## 2022-09-05 ENCOUNTER — Other Ambulatory Visit: Payer: Self-pay | Admitting: Radiology

## 2022-09-05 DIAGNOSIS — R972 Elevated prostate specific antigen [PSA]: Secondary | ICD-10-CM

## 2022-09-05 DIAGNOSIS — E119 Type 2 diabetes mellitus without complications: Secondary | ICD-10-CM

## 2022-09-05 NOTE — Telephone Encounter (Signed)
-----   Message from Velna Hatchet, RT sent at 08/21/2022 10:48 AM EST ----- Regarding: Tue 2/20 lab Future lab orders needed for a 6 month follow up appt, please.  Thanks, Anda Kraft

## 2022-09-11 ENCOUNTER — Telehealth: Payer: Self-pay | Admitting: Family Medicine

## 2022-09-11 ENCOUNTER — Other Ambulatory Visit (INDEPENDENT_AMBULATORY_CARE_PROVIDER_SITE_OTHER): Payer: BC Managed Care – PPO

## 2022-09-11 DIAGNOSIS — R972 Elevated prostate specific antigen [PSA]: Secondary | ICD-10-CM

## 2022-09-11 DIAGNOSIS — E119 Type 2 diabetes mellitus without complications: Secondary | ICD-10-CM

## 2022-09-11 LAB — COMPREHENSIVE METABOLIC PANEL
ALT: 15 U/L (ref 0–53)
AST: 19 U/L (ref 0–37)
Albumin: 4 g/dL (ref 3.5–5.2)
Alkaline Phosphatase: 48 U/L (ref 39–117)
BUN: 11 mg/dL (ref 6–23)
CO2: 29 mEq/L (ref 19–32)
Calcium: 9.5 mg/dL (ref 8.4–10.5)
Chloride: 106 mEq/L (ref 96–112)
Creatinine, Ser: 0.83 mg/dL (ref 0.40–1.50)
GFR: 96.44 mL/min (ref >60.00)
Glucose, Bld: 97 mg/dL (ref 70–99)
Potassium: 4.3 mEq/L (ref 3.5–5.1)
Sodium: 142 mEq/L (ref 135–145)
Total Bilirubin: 0.6 mg/dL (ref 0.2–1.2)
Total Protein: 6.9 g/dL (ref 6.0–8.3)

## 2022-09-11 LAB — LIPID PANEL
Cholesterol: 139 mg/dL (ref 0–200)
HDL: 36.5 mg/dL — ABNORMAL LOW (ref >39.00)
LDL Cholesterol: 72 mg/dL (ref 0–99)
NonHDL: 102.42
Total CHOL/HDL Ratio: 4
Triglycerides: 154 mg/dL — ABNORMAL HIGH (ref 0.0–149.0)
VLDL: 30.8 mg/dL (ref 0.0–40.0)

## 2022-09-11 LAB — HEMOGLOBIN A1C: Hgb A1c MFr Bld: 6.5 % (ref 4.6–6.5)

## 2022-09-11 NOTE — Progress Notes (Signed)
No critical labs need to be addressed urgently. We will discuss labs in detail at upcoming office visit.   

## 2022-09-11 NOTE — Telephone Encounter (Signed)
-----   Message from Velna Hatchet, RT sent at 09/05/2022  8:15 AM EST ----- Regarding: Mon 2/26 lab Future lab orders needed for a 6 month follow up appt, please.  Thanks, Anda Kraft

## 2022-09-12 ENCOUNTER — Ambulatory Visit: Payer: BC Managed Care – PPO | Admitting: Family Medicine

## 2022-09-13 LAB — PSA, TOTAL AND FREE
PSA, % Free: 24 % (calc) — ABNORMAL LOW (ref >25)
PSA, Free: 0.5 ng/mL
PSA, Total: 2.1 ng/mL (ref <=4.0)

## 2022-09-14 ENCOUNTER — Ambulatory Visit: Payer: BC Managed Care – PPO | Admitting: Family Medicine

## 2022-09-14 ENCOUNTER — Encounter: Payer: Self-pay | Admitting: Family Medicine

## 2022-09-14 VITALS — BP 140/80 | HR 89 | Temp 97.8°F | Ht 68.5 in | Wt 159.5 lb

## 2022-09-14 DIAGNOSIS — E785 Hyperlipidemia, unspecified: Secondary | ICD-10-CM | POA: Diagnosis not present

## 2022-09-14 DIAGNOSIS — E1169 Type 2 diabetes mellitus with other specified complication: Secondary | ICD-10-CM | POA: Diagnosis not present

## 2022-09-14 DIAGNOSIS — E119 Type 2 diabetes mellitus without complications: Secondary | ICD-10-CM | POA: Diagnosis not present

## 2022-09-14 NOTE — Progress Notes (Signed)
Patient ID: Joseph Lam, male    DOB: 1965/09/30, 57 y.o.   MRN: [id number]  This visit was conducted in person.  BP (!) 140/80   Pulse 89   Temp 97.8 F (36.6 C) (Temporal)   Ht 5' 8.5" (1.74 m)   Wt 159 lb 8 oz (72.3 kg)   SpO2 98%   BMI 23.90 kg/m    CC:  Chief Complaint  Patient presents with   Diabetes    Subjective:   HPI: Joseph Lam is a 57 y.o. male presenting on 09/14/2022 for Diabetes   Going to PT for neck and scoliosis issues.  Elevated Cholesterol: LDL  was at goal on atorvastatin 40 mg daily. Lab Results  Component Value Date   CHOL 139 09/11/2022   HDL 36.50 (L) 09/11/2022   LDLCALC 72 09/11/2022   LDLDIRECT 145.0 03/08/2022   TRIG 154.0 (H) 09/11/2022   CHOLHDL 4 09/11/2022  Using medications without problems: Muscle aches:  Diet compliance: good Exercise: off and on Other complaints: Wt Readings from Last 3 Encounters:  09/14/22 159 lb 8 oz (72.3 kg)  07/19/22 155 lb (70.3 kg)  03/10/22 153 lb (69.4 kg)     Diabetes: At goal on metformin 1000 mg p.o. daily ( He had less stomach issue on  XR) and Januvia 100 mg p.o. daily Lab Results  Component Value Date   HGBA1C 6.5 09/11/2022  Using medications without difficulties: some  but tolerable Hypoglycemic episodes: Hyperglycemic episodes: Feet problems: no ulcers Blood Sugars averaging: eye exam within last year: due  Mild intermittent asthma: Rare issue   He is now seeing a psychiatrist for MDD and possible ADD. Chronic insomnia: Well-controlled on trazodone 50 mg p.o. daily.     Wt Readings from Last 3 Encounters:  09/14/22 159 lb 8 oz (72.3 kg)  07/19/22 155 lb (70.3 kg)  03/10/22 153 lb (69.4 kg)     Relevant past medical, surgical, family and social history reviewed and updated as indicated. Interim medical history since our last visit reviewed. Allergies and medications reviewed and updated. Outpatient Medications Prior to Visit  Medication Sig Dispense Refill    acetaminophen (TYLENOL) 500 MG tablet Take 1,000 mg by mouth in the morning and at bedtime.     aspirin 81 MG tablet Take 81 mg by mouth daily.     atorvastatin (LIPITOR) 40 MG tablet TAKE 1 TABLET BY MOUTH EVERY DAY 90 tablet 3   Blood Glucose Monitoring Suppl (ONE TOUCH ULTRA 2) w/Device KIT CHECK BLOOD SUGAR DAILY  0   Choline Fenofibrate (FENOFIBRIC ACID) 135 MG CPDR Take 1 tablet by mouth daily. 90 capsule 3   cyanocobalamin (VITAMIN B12) 1000 MCG tablet Take 1,000 mcg by mouth daily.     gabapentin (NEURONTIN) 300 MG capsule Take 1 capsule (300 mg total) by mouth 3 (three) times daily. 270 capsule 0   JANUVIA 100 MG tablet TAKE 1 TABLET BY MOUTH EVERY DAY 30 tablet 5   meloxicam (MOBIC) 15 MG tablet Take 15 mg by mouth daily.     metFORMIN (GLUCOPHAGE) 500 MG tablet TAKE 2 TABLETS (1,000 MG TOTAL) BY MOUTH AT BEDTIME. 180 tablet 3   MICROLET LANCETS MISC Check blood sugar 1-2 times daily and as directed. 250.00 100 each 11   Multiple Vitamins-Minerals (MULTIVITAMIN WITH MINERALS) tablet Take 1 tablet by mouth at bedtime.     Omeprazole Magnesium (PRILOSEC OTC PO) Take 20 mg by mouth at bedtime.  ONETOUCH ULTRA test strip CHECK BLOOD SUGAR DAILY 100 each 3   traZODone (DESYREL) 50 MG tablet TAKE 1/2 TO 1 TABLET (25-50 MG TOTAL) BY MOUTH AT BEDTIME AS NEEDED FOR SLEEP. 90 tablet 1   valACYclovir (VALTREX) 1000 MG tablet Take 1 tablet (1,000 mg total) by mouth 3 (three) times daily. 21 tablet 0   No facility-administered medications prior to visit.     Per HPI unless specifically indicated in ROS section below Review of Systems  Constitutional:  Negative for fatigue and fever.  HENT:  Negative for ear pain.   Eyes:  Negative for pain.  Respiratory:  Negative for cough and shortness of breath.   Cardiovascular:  Negative for chest pain, palpitations and leg swelling.  Gastrointestinal:  Negative for abdominal pain.  Genitourinary:  Negative for dysuria.  Musculoskeletal:  Negative  for arthralgias.  Neurological:  Negative for syncope, light-headedness and headaches.  Psychiatric/Behavioral:  Negative for dysphoric mood.    Objective:  BP (!) 140/80   Pulse 89   Temp 97.8 F (36.6 C) (Temporal)   Ht 5' 8.5" (1.74 m)   Wt 159 lb 8 oz (72.3 kg)   SpO2 98%   BMI 23.90 kg/m   Wt Readings from Last 3 Encounters:  09/14/22 159 lb 8 oz (72.3 kg)  07/19/22 155 lb (70.3 kg)  03/10/22 153 lb (69.4 kg)      Physical Exam Constitutional:      General: He is not in acute distress.    Appearance: Normal appearance. He is well-developed. He is not ill-appearing or toxic-appearing.  HENT:     Head: Normocephalic and atraumatic.     Right Ear: Hearing, tympanic membrane, ear canal and external ear normal.     Left Ear: Hearing, tympanic membrane, ear canal and external ear normal.     Nose: Nose normal.     Mouth/Throat:     Pharynx: Uvula midline.  Eyes:     General: Lids are normal. Lids are everted, no foreign bodies appreciated.     Conjunctiva/sclera: Conjunctivae normal.     Pupils: Pupils are equal, round, and reactive to light.  Neck:     Thyroid: No thyroid mass or thyromegaly.     Vascular: No carotid bruit.     Trachea: Trachea and phonation normal.  Cardiovascular:     Rate and Rhythm: Normal rate and regular rhythm.     Pulses: Normal pulses.     Heart sounds: S1 normal and S2 normal. No murmur heard.    No gallop.  Pulmonary:     Breath sounds: Normal breath sounds. No wheezing, rhonchi or rales.  Abdominal:     General: Bowel sounds are normal.     Palpations: Abdomen is soft.     Tenderness: There is no abdominal tenderness. There is no guarding or rebound.     Hernia: No hernia is present.  Musculoskeletal:     Cervical back: Normal range of motion and neck supple.  Lymphadenopathy:     Cervical: No cervical adenopathy.  Skin:    General: Skin is warm and dry.     Findings: No rash.  Neurological:     Mental Status: He is alert.      Cranial Nerves: No cranial nerve deficit.     Sensory: No sensory deficit.     Gait: Gait normal.     Deep Tendon Reflexes: Reflexes are normal and symmetric.  Psychiatric:        Speech: Speech normal.  Behavior: Behavior normal.        Judgment: Judgment normal.    Diabetic foot exam: Normal inspection No skin breakdown No calluses  Normal DP pulses Normal sensation to light touch and monofilament Nails normal     Results for orders placed or performed in visit on 09/11/22  PSA, total and free  Result Value Ref Range   PSA, Total 2.1 < OR = 4.0 ng/mL   PSA, Free 0.5 ng/mL   PSA, % Free 24 (L) >25 % (calc)  Comprehensive metabolic panel  Result Value Ref Range   Sodium 142 135 - 145 mEq/L   Potassium 4.3 3.5 - 5.1 mEq/L   Chloride 106 96 - 112 mEq/L   CO2 29 19 - 32 mEq/L   Glucose, Bld 97 70 - 99 mg/dL   BUN 11 6 - 23 mg/dL   Creatinine, Ser 0.83 0.40 - 1.50 mg/dL   Total Bilirubin 0.6 0.2 - 1.2 mg/dL   Alkaline Phosphatase 48 39 - 117 U/L   AST 19 0 - 37 U/L   ALT 15 0 - 53 U/L   Total Protein 6.9 6.0 - 8.3 g/dL   Albumin 4.0 3.5 - 5.2 g/dL   GFR 96.44 >60.00 mL/min   Calcium 9.5 8.4 - 10.5 mg/dL  Lipid panel  Result Value Ref Range   Cholesterol 139 0 - 200 mg/dL   Triglycerides 154.0 (H) 0.0 - 149.0 mg/dL   HDL 36.50 (L) >39.00 mg/dL   VLDL 30.8 0.0 - 40.0 mg/dL   LDL Cholesterol 72 0 - 99 mg/dL   Total CHOL/HDL Ratio 4    NonHDL 102.42   Hemoglobin A1c  Result Value Ref Range   Hgb A1c MFr Bld 6.5 4.6 - 6.5 %     COVID 19 screen:  No recent travel or known exposure to COVID19 The patient denies respiratory symptoms of COVID 19 at this time. The importance of social distancing was discussed today.   Assessment and Plan Hyperlipidemia associated with type 2 diabetes mellitus ([place]) Assessment & Plan: Stable, chronic.  Continue current medication.   atorvastatin 40 mg daily   Type II diabetes mellitus, well controlled ([place]) Assessment &  Plan: Stable, chronic.  Continue current medication.   Metofrmin 500 mg 2 tabs daily Jardiance 100 mg daily     EYE exam:  Due   Eliezer Lofts, MD

## 2022-09-14 NOTE — Patient Instructions (Signed)
Set up yearly eye exam for diabetes and have the opthalmologist send [place] a copy of the evaluation for the chart.

## 2022-09-14 NOTE — Assessment & Plan Note (Signed)
Stable, chronic.  Continue current medication.    atorvastatin 40 mg daily. 

## 2022-09-14 NOTE — Progress Notes (Signed)
No critical labs need to be addressed urgently. We will discuss labs in detail at upcoming office visit.   

## 2022-09-14 NOTE — Assessment & Plan Note (Signed)
Stable, chronic.  Continue current medication.   Metofrmin 500 mg 2 tabs daily Jardiance 100 mg daily

## 2023-03-29 ENCOUNTER — Other Ambulatory Visit: Payer: Self-pay | Admitting: Family Medicine

## 2023-03-29 NOTE — Telephone Encounter (Signed)
Lvm for patient tcb and schedule 

## 2023-03-29 NOTE — Telephone Encounter (Signed)
Please schedule CPE with fasting labs prior with Dr. Bedsole.  

## 2023-05-01 ENCOUNTER — Telehealth: Payer: Self-pay | Admitting: *Deleted

## 2023-05-01 DIAGNOSIS — E119 Type 2 diabetes mellitus without complications: Secondary | ICD-10-CM

## 2023-05-01 DIAGNOSIS — Z125 Encounter for screening for malignant neoplasm of prostate: Secondary | ICD-10-CM

## 2023-05-01 DIAGNOSIS — E1169 Type 2 diabetes mellitus with other specified complication: Secondary | ICD-10-CM

## 2023-05-01 NOTE — Telephone Encounter (Signed)
-----   Message from Alvina Chou sent at 04/23/2023  3:34 PM EDT ----- Regarding: Lab orders for [facility], 10.24.24 Patient is scheduled for CPX labs, please order future labs, Thanks , Camelia Eng

## 2023-05-02 DIAGNOSIS — R972 Elevated prostate specific antigen [PSA]: Secondary | ICD-10-CM | POA: Insufficient documentation

## 2023-05-02 NOTE — Addendum Note (Signed)
Addended by: Kerby Nora E on: 05/02/2023 02:01 PM   Modules accepted: Orders

## 2023-05-10 ENCOUNTER — Other Ambulatory Visit: Payer: BC Managed Care – PPO

## 2023-05-17 ENCOUNTER — Encounter: Payer: BC Managed Care – PPO | Admitting: Family Medicine

## 2023-05-22 ENCOUNTER — Other Ambulatory Visit: Payer: Self-pay | Admitting: Family Medicine

## 2023-06-20 ENCOUNTER — Other Ambulatory Visit (INDEPENDENT_AMBULATORY_CARE_PROVIDER_SITE_OTHER): Payer: BC Managed Care – PPO

## 2023-06-20 DIAGNOSIS — E785 Hyperlipidemia, unspecified: Secondary | ICD-10-CM | POA: Diagnosis not present

## 2023-06-20 DIAGNOSIS — Z125 Encounter for screening for malignant neoplasm of prostate: Secondary | ICD-10-CM

## 2023-06-20 DIAGNOSIS — E119 Type 2 diabetes mellitus without complications: Secondary | ICD-10-CM

## 2023-06-20 DIAGNOSIS — E1169 Type 2 diabetes mellitus with other specified complication: Secondary | ICD-10-CM | POA: Diagnosis not present

## 2023-06-20 LAB — COMPREHENSIVE METABOLIC PANEL
ALT: 14 U/L (ref 0–53)
AST: 16 U/L (ref 0–37)
Albumin: 4.5 g/dL (ref 3.5–5.2)
Alkaline Phosphatase: 48 U/L (ref 39–117)
BUN: 15 mg/dL (ref 6–23)
CO2: 32 meq/L (ref 19–32)
Calcium: 9.5 mg/dL (ref 8.4–10.5)
Chloride: 103 meq/L (ref 96–112)
Creatinine, Ser: 0.92 mg/dL (ref 0.40–1.50)
GFR: 91.17 mL/min (ref >60.00)
Glucose, Bld: 139 mg/dL — ABNORMAL HIGH (ref 70–99)
Potassium: 4.1 meq/L (ref 3.5–5.1)
Sodium: 140 meq/L (ref 135–145)
Total Bilirubin: 0.8 mg/dL (ref 0.2–1.2)
Total Protein: 7 g/dL (ref 6.0–8.3)

## 2023-06-20 LAB — LIPID PANEL
Cholesterol: 250 mg/dL — ABNORMAL HIGH (ref 0–200)
HDL: 46.6 mg/dL (ref >39.00)
LDL Cholesterol: 177 mg/dL — ABNORMAL HIGH (ref 0–99)
NonHDL: 203.11
Total CHOL/HDL Ratio: 5
Triglycerides: 130 mg/dL (ref 0.0–149.0)
VLDL: 26 mg/dL (ref 0.0–40.0)

## 2023-06-20 LAB — HEMOGLOBIN A1C: Hgb A1c MFr Bld: 6.2 % (ref 4.6–6.5)

## 2023-06-20 LAB — MICROALBUMIN / CREATININE URINE RATIO
Creatinine,U: 97.3 mg/dL
Microalb Creat Ratio: 0.7 mg/g (ref 0.0–30.0)
Microalb, Ur: <0.7 mg/dL (ref 0.0–1.9)

## 2023-06-20 LAB — PSA: PSA: 2.47 ng/mL (ref 0.10–4.00)

## 2023-06-21 NOTE — Progress Notes (Signed)
No critical labs need to be addressed urgently. We will discuss labs in detail at upcoming office visit.   

## 2023-06-26 ENCOUNTER — Ambulatory Visit (INDEPENDENT_AMBULATORY_CARE_PROVIDER_SITE_OTHER): Payer: BC Managed Care – PPO | Admitting: Family Medicine

## 2023-06-26 ENCOUNTER — Encounter: Payer: Self-pay | Admitting: Family Medicine

## 2023-06-26 VITALS — BP 140/88 | HR 91 | Temp 97.4°F | Ht 68.5 in | Wt 157.0 lb

## 2023-06-26 DIAGNOSIS — E119 Type 2 diabetes mellitus without complications: Secondary | ICD-10-CM

## 2023-06-26 DIAGNOSIS — J4520 Mild intermittent asthma, uncomplicated: Secondary | ICD-10-CM

## 2023-06-26 DIAGNOSIS — Z0000 Encounter for general adult medical examination without abnormal findings: Secondary | ICD-10-CM | POA: Diagnosis not present

## 2023-06-26 DIAGNOSIS — E785 Hyperlipidemia, unspecified: Secondary | ICD-10-CM

## 2023-06-26 DIAGNOSIS — Z23 Encounter for immunization: Secondary | ICD-10-CM | POA: Diagnosis not present

## 2023-06-26 DIAGNOSIS — Z7984 Long term (current) use of oral hypoglycemic drugs: Secondary | ICD-10-CM

## 2023-06-26 DIAGNOSIS — F5104 Psychophysiologic insomnia: Secondary | ICD-10-CM

## 2023-06-26 DIAGNOSIS — E1169 Type 2 diabetes mellitus with other specified complication: Secondary | ICD-10-CM

## 2023-06-26 DIAGNOSIS — R972 Elevated prostate specific antigen [PSA]: Secondary | ICD-10-CM | POA: Diagnosis not present

## 2023-06-26 DIAGNOSIS — L239 Allergic contact dermatitis, unspecified cause: Secondary | ICD-10-CM

## 2023-06-26 MED ORDER — TRAZODONE HCL 50 MG PO TABS: ORAL_TABLET | ORAL | 1 refills | Status: DC

## 2023-06-26 MED ORDER — MICROLET LANCETS MISC: 11 refills | Status: AC

## 2023-06-26 MED ORDER — SITAGLIPTIN PHOSPHATE 100 MG PO TABS: 100.0000 mg | ORAL_TABLET | Freq: Every day | ORAL | 1 refills | Status: DC

## 2023-06-26 MED ORDER — TRIAMCINOLONE ACETONIDE 0.5 % EX CREA: 1.0000 | TOPICAL_CREAM | Freq: Two times a day (BID) | CUTANEOUS | 0 refills | Status: DC

## 2023-06-26 MED ORDER — MELOXICAM 15 MG PO TABS: 15.0000 mg | ORAL_TABLET | Freq: Every day | ORAL | 1 refills | Status: DC

## 2023-06-26 MED ORDER — ATORVASTATIN CALCIUM 20 MG PO TABS: 20.0000 mg | ORAL_TABLET | Freq: Every day | ORAL | 3 refills | Status: DC

## 2023-06-26 MED ORDER — ONETOUCH ULTRA VI STRP: ORAL_STRIP | 3 refills | Status: AC

## 2023-06-26 NOTE — Addendum Note (Signed)
Addended by: Damita Lack on: 06/26/2023 12:15 PM   Modules accepted: Orders

## 2023-06-26 NOTE — Assessment & Plan Note (Signed)
No flares.

## 2023-06-26 NOTE — Assessment & Plan Note (Signed)
Elevation from last year has stabilized/decreased.

## 2023-06-26 NOTE — Assessment & Plan Note (Signed)
Stable, chronic.  Continue current medication.    trazodone 1/2 to 1 tab of 50 mg daily at bedtime

## 2023-06-26 NOTE — Assessment & Plan Note (Signed)
ACute.. most likely contact or allergic dermatitis. Not clearly typical appearance of  fungal infection.  Treat with steroid cream.

## 2023-06-26 NOTE — Progress Notes (Signed)
Patient ID: Joseph Lam, male    DOB: [birth year], 57 y.o.   MRN: [id number]  This visit was conducted in person.  BP (!) 140/88 (BP Location: Left Arm, Patient Position: Sitting, Cuff Size: Normal)   Pulse 91   Temp (!) 97.4 F (36.3 C) (Temporal)   Ht 5' 8.5" (1.74 m)   Wt 157 lb (71.2 kg)   SpO2 96%   BMI 23.52 kg/m    CC: Chief Complaint  Patient presents with   Annual Exam    Subjective:   HPI: Joseph Lam is a 57 y.o. male presenting on 06/26/2023 for Annual Exam The patient presents for complete physical and review of chronic health problems. He/She also has the following acute concerns today: rash ongoing for 2-3 month... at anterior belt line   Very itchy, scratches it, then it scabs.  Has not tried treating with anything.   Diabetes:  At goal on current regimen... but recently BS are more elevated... given poor diet. Lab Results  Component Value Date   HGBA1C 6.2 06/20/2023  Using medications without difficulties: Hypoglycemic episodes: none Hyperglycemic episodes:none Feet problems:none Blood Sugars averaging:   FBS 130-160 eye exam within last year: due.. scheduled Wt Readings from Last 3 Encounters:  06/26/23 157 lb (71.2 kg)  09/14/22 159 lb 8 oz (72.3 kg)  07/19/22 155 lb (70.3 kg)  Body mass index is 23.52 kg/m.   Elevated Cholesterol:  LDL was  at goal < 100 on atorvastatin 40 mg daily... stopped given aches and pains... now poor control. Lab Results  Component Value Date   CHOL 250 (H) 06/20/2023   HDL 46.60 06/20/2023   LDLCALC 177 (H) 06/20/2023   LDLDIRECT 145.0 03/08/2022   TRIG 130.0 06/20/2023   CHOLHDL 5 06/20/2023  Using medications without problems:none Muscle aches: none Diet compliance: hearth healthy Exercise: minimal Other complaints:  Mild intermittent asthma: no flares.  Chronic insomnia: stable on trazodone.  Patient reports he has been having increased depression over the last year but is doing much better now  after counseling.   Relevant past medical, surgical, family and social history reviewed and updated as indicated. Interim medical history since our last visit reviewed. Allergies and medications reviewed and updated. Outpatient Medications Prior to Visit  Medication Sig Dispense Refill   acetaminophen (TYLENOL) 500 MG tablet Take 1,000 mg by mouth in the morning and at bedtime.     aspirin 81 MG tablet Take 81 mg by mouth daily.     B Complex-Biotin-FA (B-COMPLEX PO) Take 1 tablet by mouth daily.     Blood Glucose Monitoring Suppl (ONE TOUCH ULTRA 2) w/Device KIT CHECK BLOOD SUGAR DAILY  0   Choline Fenofibrate (FENOFIBRIC ACID) 135 MG CPDR Take 1 tablet by mouth daily. 90 capsule 3   metFORMIN (GLUCOPHAGE) 500 MG tablet TAKE 2 TABLETS (1,000 MG TOTAL) BY MOUTH AT BEDTIME. (Patient taking differently: Take 500 mg by mouth at bedtime.) 180 tablet 0   Multiple Vitamins-Minerals (MULTIVITAMIN WITH MINERALS) tablet Take 1 tablet by mouth at bedtime.     Omeprazole Magnesium (PRILOSEC OTC PO) Take 20 mg by mouth daily as needed.     VITAMIN D PO Take 1 tablet by mouth daily.     MICROLET LANCETS MISC Check blood sugar 1-2 times daily and as directed. 250.00 100 each 11   ONETOUCH ULTRA test strip CHECK BLOOD SUGAR DAILY 100 each 3   sitaGLIPtin (JANUVIA) 100 MG tablet TAKE 1 TABLET BY MOUTH  EVERY DAY 30 tablet 1   traZODone (DESYREL) 50 MG tablet TAKE 1/2 TO 1 TABLET (25-50 MG TOTAL) BY MOUTH AT BEDTIME AS NEEDED FOR SLEEP. 90 tablet 1   gabapentin (NEURONTIN) 300 MG capsule Take 1 capsule (300 mg total) by mouth 3 (three) times daily. (Patient not taking: Reported on 06/26/2023) 270 capsule 0   atorvastatin (LIPITOR) 40 MG tablet TAKE 1 TABLET BY MOUTH EVERY DAY (Patient not taking: Reported on 06/26/2023) 90 tablet 3   cyanocobalamin (VITAMIN B12) 1000 MCG tablet Take 1,000 mcg by mouth daily.     meloxicam (MOBIC) 15 MG tablet Take 15 mg by mouth daily. (Patient not taking: Reported on  06/26/2023)     valACYclovir (VALTREX) 1000 MG tablet Take 1 tablet (1,000 mg total) by mouth 3 (three) times daily. 21 tablet 0   No facility-administered medications prior to visit.     Per HPI unless specifically indicated in ROS section below Review of Systems  Constitutional:  Negative for fatigue and fever.  HENT:  Negative for ear pain.   Eyes:  Negative for pain.  Respiratory:  Negative for cough and shortness of breath.   Cardiovascular:  Negative for chest pain, palpitations and leg swelling.  Gastrointestinal:  Negative for abdominal pain.  Genitourinary:  Negative for dysuria.  Musculoskeletal:  Negative for arthralgias.  Neurological:  Negative for syncope, light-headedness and headaches.  Psychiatric/Behavioral:  Negative for dysphoric mood.    Objective:  BP (!) 140/88 (BP Location: Left Arm, Patient Position: Sitting, Cuff Size: Normal)   Pulse 91   Temp (!) 97.4 F (36.3 C) (Temporal)   Ht 5' 8.5" (1.74 m)   Wt 157 lb (71.2 kg)   SpO2 96%   BMI 23.52 kg/m   Wt Readings from Last 3 Encounters:  06/26/23 157 lb (71.2 kg)  09/14/22 159 lb 8 oz (72.3 kg)  07/19/22 155 lb (70.3 kg)      Physical Exam Constitutional:      General: He is not in acute distress.    Appearance: Normal appearance. He is well-developed. He is not ill-appearing or toxic-appearing.  HENT:     Head: Normocephalic and atraumatic.     Right Ear: Hearing, tympanic membrane, ear canal and external ear normal.     Left Ear: Hearing, tympanic membrane, ear canal and external ear normal.     Nose: Nose normal.     Mouth/Throat:     Pharynx: Uvula midline.  Eyes:     General: Lids are normal. Lids are everted, no foreign bodies appreciated.     Conjunctiva/sclera: Conjunctivae normal.     Pupils: Pupils are equal, round, and reactive to light.  Neck:     Thyroid: No thyroid mass or thyromegaly.     Vascular: No carotid bruit.     Trachea: Trachea and phonation normal.  Cardiovascular:      Rate and Rhythm: Normal rate and regular rhythm.     Pulses: Normal pulses.     Heart sounds: S1 normal and S2 normal. No murmur heard.    No gallop.  Pulmonary:     Breath sounds: Normal breath sounds. No wheezing, rhonchi or rales.  Abdominal:     General: Bowel sounds are normal.     Palpations: Abdomen is soft.     Tenderness: There is no abdominal tenderness. There is no guarding or rebound.     Hernia: No hernia is present.  Musculoskeletal:     Cervical back: Normal range of motion  and neck supple.  Lymphadenopathy:     Cervical: No cervical adenopathy.  Skin:    General: Skin is warm and dry.     Findings: Rash present.          Comments: Linear erythematous scaly rash at belt buckle, nonblanching, no pustules no discharge.  Neurological:     Mental Status: He is alert.     Cranial Nerves: No cranial nerve deficit.     Sensory: No sensory deficit.     Gait: Gait normal.     Deep Tendon Reflexes: Reflexes are normal and symmetric.  Psychiatric:        Speech: Speech normal.        Behavior: Behavior normal.        Judgment: Judgment normal.    Diabetic foot exam: Normal inspection No skin breakdown No calluses  Normal DP pulses Normal sensation to light touch and monofilament Nails normal     Results for orders placed or performed in visit on 06/20/23  PSA  Result Value Ref Range   PSA 2.47 0.10 - 4.00 ng/mL  Microalbumin / creatinine urine ratio  Result Value Ref Range   Microalb, Ur <0.7 0.0 - 1.9 mg/dL   Creatinine,U 40.1 mg/dL   Microalb Creat Ratio 0.7 0.0 - 30.0 mg/g  Lipid panel  Result Value Ref Range   Cholesterol 250 (H) 0 - 200 mg/dL   Triglycerides 027.2 0.0 - 149.0 mg/dL   HDL 53.66 >44.03 mg/dL   VLDL 47.4 0.0 - 25.9 mg/dL   LDL Cholesterol 563 (H) 0 - 99 mg/dL   Total CHOL/HDL Ratio 5    NonHDL 203.11   Hemoglobin A1c  Result Value Ref Range   Hgb A1c MFr Bld 6.2 4.6 - 6.5 %  Comprehensive metabolic panel  Result Value Ref  Range   Sodium 140 135 - 145 mEq/L   Potassium 4.1 3.5 - 5.1 mEq/L   Chloride 103 96 - 112 mEq/L   CO2 32 19 - 32 mEq/L   Glucose, Bld 139 (H) 70 - 99 mg/dL   BUN 15 6 - 23 mg/dL   Creatinine, Ser 8.75 0.40 - 1.50 mg/dL   Total Bilirubin 0.8 0.2 - 1.2 mg/dL   Alkaline Phosphatase 48 39 - 117 U/L   AST 16 0 - 37 U/L   ALT 14 0 - 53 U/L   Total Protein 7.0 6.0 - 8.3 g/dL   Albumin 4.5 3.5 - 5.2 g/dL   GFR 64.33 >29.51 mL/min   Calcium 9.5 8.4 - 10.5 mg/dL    This visit occurred during the SARS-CoV-2 public health emergency.  Safety protocols were in place, including screening questions prior to the visit, additional usage of staff PPE, and extensive cleaning of exam room while observing appropriate contact time as indicated for disinfecting solutions.   COVID 19 screen:  No recent travel or known exposure to COVID19 The patient denies respiratory symptoms of COVID 19 at this time. The importance of social distancing was discussed today.   Assessment and Plan The patient's preventative maintenance and recommended screening tests for an annual wellness exam were reviewed in full today. Brought up to date unless services declined.  Counselled on the importance of diet, exercise, and its role in overall health and mortality. The patient's FH and SH was reviewed, including their home life, tobacco status, and drug and alcohol status.   Prostate cancer  In Dad  At age 109.   Lab Results  Component Value Date  PSA 2.47 06/20/2023   PSA 2.62 03/08/2022   PSA 1.71 02/24/2021  Vaccines:  Uptodate, COVID 19 x 2, PNA, Tdap, consider shingrix Due for flu. Colon cancer screening:  03/2020 Pyrtle repeat in 5 years Nonsmoker  HIV: refused. No ETOH.drug use Hep C: done EYE exam:  Due   Brother with  colon cancer, no lesion on liver.  Problem List Items Addressed This Visit     Abnormal PSA    Elevation from last year has stabilized/decreased.      Allergic dermatitis     ACute..  most likely contact or allergic dermatitis. Not clearly typical appearance of  fungal infection.  Treat with steroid cream.      Chronic insomnia    Stable, chronic.  Continue current medication.    trazodone 1/2 to 1 tab of 50 mg daily at bedtime      Hyperlipidemia associated with type 2 diabetes mellitus ([facility])     Poor control off statin. Will try lower dose  atorvasatin along with CoQ10.  atorvastatin 20 mg daily      Relevant Medications   sitaGLIPtin (JANUVIA) 100 MG tablet   atorvastatin (LIPITOR) 20 MG tablet   Mild intermittent asthma    No flares.      Type II diabetes mellitus, well controlled ([facility])    Stable, chronic.  Continue current medication.   Metofrmin 500 mg 2 tabs daily Jardiance 100 mg daily      Relevant Medications   sitaGLIPtin (JANUVIA) 100 MG tablet   atorvastatin (LIPITOR) 20 MG tablet   Other Visit Diagnoses     Routine general medical examination at a health care facility    -  Primary          Kerby Nora, MD

## 2023-06-26 NOTE — Assessment & Plan Note (Addendum)
Poor control off statin. Will try lower dose  atorvasatin along with CoQ10.  atorvastatin 20 mg daily

## 2023-06-26 NOTE — Assessment & Plan Note (Signed)
Stable, chronic.  Continue current medication.   Metofrmin 500 mg 2 tabs daily Jardiance 100 mg daily 

## 2023-06-26 NOTE — Patient Instructions (Addendum)
Set up yearly eye exam for diabetes and have the opthalmologist send [place] a copy of the evaluation for the chart. Restart atorvastatin but at lower dose 20 mg daily along with Coenzyme Q10 ( CoQ10) daily for side effects.

## 2023-08-29 ENCOUNTER — Other Ambulatory Visit: Payer: Self-pay | Admitting: Family Medicine

## 2023-08-30 NOTE — Telephone Encounter (Signed)
Last office vsiit 06/26/2023 for CPE.  Last refilled 06/20/22 for #[id number] with no refills.  Next Appt: 12/25/23 for DM.

## 2023-09-25 ENCOUNTER — Ambulatory Visit: Payer: Self-pay

## 2023-10-02 ENCOUNTER — Ambulatory Visit (INDEPENDENT_AMBULATORY_CARE_PROVIDER_SITE_OTHER): Payer: Self-pay

## 2023-10-02 DIAGNOSIS — Z23 Encounter for immunization: Secondary | ICD-10-CM | POA: Diagnosis not present

## 2023-10-02 NOTE — Progress Notes (Signed)
 Per orders of Dr. Kerby Nora, injection of shingrix given by Lewanda Rife in left deltoid. Patient tolerated injection well.

## 2023-11-26 ENCOUNTER — Other Ambulatory Visit: Payer: Self-pay | Admitting: Family Medicine

## 2023-11-26 NOTE — Telephone Encounter (Signed)
 Last office visit 06/26/2023 for CPE.  Last refilled 06/26/2023 for #90 with 1 refill.  Next appt: 12/25/23 for DM.

## 2023-12-20 ENCOUNTER — Other Ambulatory Visit: Payer: Self-pay | Admitting: Family Medicine

## 2023-12-25 ENCOUNTER — Ambulatory Visit: Payer: BC Managed Care – PPO | Admitting: Family Medicine

## 2023-12-27 ENCOUNTER — Encounter: Payer: Self-pay | Admitting: Family Medicine

## 2023-12-27 ENCOUNTER — Ambulatory Visit (INDEPENDENT_AMBULATORY_CARE_PROVIDER_SITE_OTHER): Admitting: Family Medicine

## 2023-12-27 VITALS — BP 150/94 | HR 79 | Temp 98.5°F | Ht 68.5 in | Wt 169.4 lb

## 2023-12-27 DIAGNOSIS — Z7984 Long term (current) use of oral hypoglycemic drugs: Secondary | ICD-10-CM | POA: Diagnosis not present

## 2023-12-27 DIAGNOSIS — E119 Type 2 diabetes mellitus without complications: Secondary | ICD-10-CM | POA: Diagnosis not present

## 2023-12-27 DIAGNOSIS — F1290 Cannabis use, unspecified, uncomplicated: Secondary | ICD-10-CM

## 2023-12-27 DIAGNOSIS — F5104 Psychophysiologic insomnia: Secondary | ICD-10-CM

## 2023-12-27 DIAGNOSIS — F329 Major depressive disorder, single episode, unspecified: Secondary | ICD-10-CM | POA: Insufficient documentation

## 2023-12-27 DIAGNOSIS — F320 Major depressive disorder, single episode, mild: Secondary | ICD-10-CM

## 2023-12-27 LAB — HM DIABETES FOOT EXAM

## 2023-12-27 LAB — POCT GLYCOSYLATED HEMOGLOBIN (HGB A1C): Hemoglobin A1C: 6 % — AB (ref 4.0–5.6)

## 2023-12-27 MED ORDER — MELOXICAM 15 MG PO TABS: 15.0000 mg | ORAL_TABLET | Freq: Every day | ORAL | 1 refills | Status: AC

## 2023-12-27 MED ORDER — TRIAMCINOLONE ACETONIDE 0.5 % EX CREA: 1.0000 | TOPICAL_CREAM | Freq: Two times a day (BID) | CUTANEOUS | 0 refills | Status: AC

## 2023-12-27 MED ORDER — SERTRALINE HCL 50 MG PO TABS: 50.0000 mg | ORAL_TABLET | Freq: Every day | ORAL | 3 refills | Status: DC

## 2023-12-27 MED ORDER — SITAGLIPTIN PHOSPHATE 100 MG PO TABS: 100.0000 mg | ORAL_TABLET | Freq: Every day | ORAL | 3 refills | Status: AC

## 2023-12-27 MED ORDER — METFORMIN HCL ER 500 MG PO TB24: 500.0000 mg | ORAL_TABLET | Freq: Every day | ORAL | 3 refills | Status: AC

## 2023-12-27 NOTE — Assessment & Plan Note (Signed)
 Chronic, acute worsening. Will start sertraline 50 mg at night.Follow up in 4 weeks.  Continue counseling.

## 2023-12-27 NOTE — Assessment & Plan Note (Signed)
 Stable, chronic.  Continue current medication.   Metofrmin 500 mg 1 tabs daily Januvia  100 mg daily

## 2023-12-27 NOTE — Assessment & Plan Note (Signed)
 Poor control.. will treat mood. Continue trazodone  200 mg at bedtime. Plan start SSRI.

## 2023-12-27 NOTE — Assessment & Plan Note (Signed)
 Longtime Marijuana use.. interested in lung cancer screening... not sure of coverage.. Will refer.

## 2023-12-27 NOTE — Progress Notes (Signed)
 Patient ID: TRAVERS GOODLEY, male    DOB: 11-23-1965, 58 y.o.   MRN: [id number]  This visit was conducted in person.  BP (!) 150/94 (BP Location: Left Arm, Patient Position: Sitting, Cuff Size: Normal)   Pulse 79   Temp 98.5 F (36.9 C) (Oral)   Ht 5' 8.5 (1.74 m)   Wt 169 lb 6.4 oz (76.8 kg)   SpO2 99%   BMI 25.38 kg/m    CC: Chief Complaint  Patient presents with   Medical Management of Chronic Issues    Subjective:   HPI: SEHAJ MCENROE is a 58 y.o. male presenting on 12/27/2023  Diabetes:  At goal on current regimen. On metfomrin 500 mg 1 tabs daily, januvia  100 mg daily  Lab Results  Component Value Date   HGBA1C 6.0 (A) 12/27/2023  Using medications without difficulties: Hypoglycemic episodes: none Hyperglycemic episodes:none Feet problems:none Blood Sugars averaging:  not checking. eye exam within last year: due.. scheduled Wt Readings from Last 3 Encounters:  12/27/23 169 lb 6.4 oz (76.8 kg)  06/26/23 157 lb (71.2 kg)  09/14/22 159 lb 8 oz (72.3 kg)  Body mass index is 25.38 kg/m.    Daily marijuana use for > 20 years  Father and family, wife heavy smokers... second hand smoke exposure.  Brother smoker with COPD. No lung cancer in family.  Chronic insomnia... trazodone  150-200 mg not helping.  In therapy.Aaron Aas 2-4 times a month.  Increased stress in life, brother in law in hospice.    12/27/2023   11:23 AM  GAD 7 : Generalized Anxiety Score  Nervous, Anxious, on Edge 1  Control/stop worrying 1  Worry too much - different things 1  Trouble relaxing 1  Restless 0  Easily annoyed or irritable 1  Afraid - awful might happen 1  Total GAD 7 Score 6  Anxiety Difficulty Somewhat difficult       12/27/2023   11:19 AM 09/14/2022   11:23 AM 03/10/2022   12:30 PM  PHQ9 SCORE ONLY  PHQ-9 Total Score 7 1 17      Relevant past medical, surgical, family and social history reviewed and updated as indicated. Interim medical history since our last visit  reviewed. Allergies and medications reviewed and updated. Outpatient Medications Prior to Visit  Medication Sig Dispense Refill   acetaminophen  (TYLENOL ) 500 MG tablet Take 1,000 mg by mouth in the morning and at bedtime.     atorvastatin  (LIPITOR) 20 MG tablet Take 1 tablet (20 mg total) by mouth daily. 90 tablet 3   B Complex-Biotin-FA (B-COMPLEX PO) Take 1 tablet by mouth daily.     Blood Glucose Monitoring Suppl (ONE TOUCH ULTRA 2) w/Device KIT CHECK BLOOD SUGAR DAILY  0   gabapentin  (NEURONTIN ) 300 MG capsule TAKE 1 CAPSULE BY MOUTH THREE TIMES A DAY 270 capsule 0   glucose blood (ONETOUCH ULTRA) test strip Use as instructed 100 each 3   Microlet Lancets MISC Check blood sugar 1-2 times daily and as directed. 250.00 100 each 11   Multiple Vitamins-Minerals (MULTIVITAMIN WITH MINERALS) tablet Take 1 tablet by mouth at bedtime.     Omeprazole Magnesium (PRILOSEC OTC PO) Take 20 mg by mouth daily as needed.     traZODone  (DESYREL ) 50 MG tablet TAKE 1/2 TO 1 TABLET (25-50 MG TOTAL) BY MOUTH AT BEDTIME AS NEEDED FOR SLEEP. 90 tablet 1   VITAMIN D PO Take 1 tablet by mouth daily.     meloxicam  (MOBIC ) 15 MG tablet  TAKE 1 TABLET (15 MG TOTAL) BY MOUTH DAILY. 90 tablet 1   metFORMIN  (GLUCOPHAGE ) 500 MG tablet TAKE 2 TABLETS (1,000 MG TOTAL) BY MOUTH AT BEDTIME. 180 tablet 0   sitaGLIPtin  (JANUVIA ) 100 MG tablet Take 1 tablet (100 mg total) by mouth daily. 30 tablet 1   triamcinolone  cream (KENALOG ) 0.5 % Apply 1 Application topically 2 (two) times daily. 30 g 0   aspirin 81 MG tablet Take 81 mg by mouth daily. (Patient not taking: Reported on 12/27/2023)     Choline Fenofibrate  (FENOFIBRIC ACID) 135 MG CPDR Take 1 tablet by mouth daily. (Patient not taking: Reported on 12/27/2023) 90 capsule 3   No facility-administered medications prior to visit.     Per HPI unless specifically indicated in ROS section below Review of Systems  Constitutional:  Negative for fatigue and fever.  HENT:  Negative  for ear pain.   Eyes:  Negative for pain.  Respiratory:  Negative for cough and shortness of breath.   Cardiovascular:  Negative for chest pain, palpitations and leg swelling.  Gastrointestinal:  Negative for abdominal pain.  Genitourinary:  Negative for dysuria.  Musculoskeletal:  Negative for arthralgias.  Neurological:  Negative for syncope, light-headedness and headaches.  Psychiatric/Behavioral:  Negative for dysphoric mood.    Objective:  BP (!) 150/94 (BP Location: Left Arm, Patient Position: Sitting, Cuff Size: Normal)   Pulse 79   Temp 98.5 F (36.9 C) (Oral)   Ht 5' 8.5 (1.74 m)   Wt 169 lb 6.4 oz (76.8 kg)   SpO2 99%   BMI 25.38 kg/m   Wt Readings from Last 3 Encounters:  12/27/23 169 lb 6.4 oz (76.8 kg)  06/26/23 157 lb (71.2 kg)  09/14/22 159 lb 8 oz (72.3 kg)      Physical Exam Constitutional:      General: He is not in acute distress.    Appearance: Normal appearance. He is well-developed. He is not ill-appearing or toxic-appearing.  HENT:     Head: Normocephalic and atraumatic.     Right Ear: Hearing, tympanic membrane, ear canal and external ear normal.     Left Ear: Hearing, tympanic membrane, ear canal and external ear normal.     Nose: Nose normal.     Mouth/Throat:     Pharynx: Uvula midline.   Eyes:     General: Lids are normal. Lids are everted, no foreign bodies appreciated.     Conjunctiva/sclera: Conjunctivae normal.     Pupils: Pupils are equal, round, and reactive to light.   Neck:     Thyroid: No thyroid mass or thyromegaly.     Vascular: No carotid bruit.     Trachea: Trachea and phonation normal.   Cardiovascular:     Rate and Rhythm: Normal rate and regular rhythm.     Pulses: Normal pulses.     Heart sounds: S1 normal and S2 normal. No murmur heard.    No gallop.  Pulmonary:     Breath sounds: Normal breath sounds. No wheezing, rhonchi or rales.  Abdominal:     General: Bowel sounds are normal.     Palpations: Abdomen is soft.      Tenderness: There is no abdominal tenderness. There is no guarding or rebound.     Hernia: No hernia is present.   Musculoskeletal:     Cervical back: Normal range of motion and neck supple.  Lymphadenopathy:     Cervical: No cervical adenopathy.   Skin:    General: Skin  is warm and dry.     Findings: No rash.   Neurological:     Mental Status: He is alert.     Cranial Nerves: No cranial nerve deficit.     Sensory: No sensory deficit.     Gait: Gait normal.     Deep Tendon Reflexes: Reflexes are normal and symmetric.   Psychiatric:        Speech: Speech normal.        Behavior: Behavior normal.        Judgment: Judgment normal.    Diabetic foot exam: Normal inspection No skin breakdown No calluses  Normal DP pulses Normal sensation to light touch and monofilament Nails normal     Results for orders placed or performed in visit on 12/27/23  HM DIABETES FOOT EXAM   Collection Time: 12/27/23 12:00 AM  Result Value Ref Range   HM Diabetic Foot Exam done   POC HgB A1c   Collection Time: 12/27/23 11:21 AM  Result Value Ref Range   Hemoglobin A1C 6.0 (A) 4.0 - 5.6 %   HbA1c POC (<> result, manual entry)     HbA1c, POC (prediabetic range)     HbA1c, POC (controlled diabetic range)      This visit occurred during the SARS-CoV-2 public health emergency.  Safety protocols were in place, including screening questions prior to the visit, additional usage of staff PPE, and extensive cleaning of exam room while observing appropriate contact time as indicated for disinfecting solutions.   COVID 19 screen:  No recent travel or known exposure to COVID19 The patient denies respiratory symptoms of COVID 19 at this time. The importance of social distancing was discussed today.   Assessment and Plan The patient's preventative maintenance and recommended screening tests for an annual wellness exam were reviewed in full today. Brought up to date unless services  declined.  Counselled on the importance of diet, exercise, and its role in overall health and mortality. The patient's FH and SH was reviewed, including their home life, tobacco status, and drug and alcohol status.   Prostate cancer  In Dad  At age 24.   Lab Results  Component Value Date   PSA 2.47 06/20/2023   PSA 2.62 03/08/2022   PSA 1.71 02/24/2021  Vaccines:  Uptodate, COVID 19 x 2, PNA, Tdap, consider shingrix  Due for flu. Colon cancer screening:  03/2020 Pyrtle repeat in 5 years Nonsmoker  HIV: refused. No ETOH.drug use Hep C: done EYE exam:  Due   Brother with  colon cancer, no lesion on liver.  Problem List Items Addressed This Visit     Chronic insomnia    Poor control.. will treat mood. Continue trazodone  200 mg at bedtime. Plan start SSRI.      Marijuana smoker, continuous    Longtime Marijuana use.. interested in lung cancer screening... not sure of coverage.. Will refer.      Relevant Orders   Ambulatory Referral Lung Cancer Screening [facility]   MDD (major depressive disorder), single episode    Chronic, acute worsening. Will start sertraline 50 mg at night.Follow up in 4 weeks.  Continue counseling.      Relevant Medications   sertraline (ZOLOFT) 50 MG tablet   Type II diabetes mellitus, well controlled (HCC) - Primary   Stable, chronic.  Continue current medication.   Metofrmin 500 mg 1 tabs daily Januvia  100 mg daily      Relevant Medications   sitaGLIPtin  (JANUVIA ) 100 MG tablet   metFORMIN  (  GLUCOPHAGE -XR) 500 MG 24 hr tablet   Other Relevant Orders   POC HgB A1c (Completed)       Herby Lolling, MD

## 2024-01-18 ENCOUNTER — Other Ambulatory Visit: Payer: Self-pay | Admitting: Family Medicine

## 2024-01-19 ENCOUNTER — Other Ambulatory Visit: Payer: Self-pay | Admitting: Family Medicine

## 2024-01-25 ENCOUNTER — Ambulatory Visit: Admitting: Family Medicine

## 2024-01-25 ENCOUNTER — Encounter: Payer: Self-pay | Admitting: Family Medicine

## 2024-01-25 VITALS — BP 140/90 | HR 80 | Temp 98.0°F | Ht 68.5 in | Wt 165.1 lb

## 2024-01-25 DIAGNOSIS — F320 Major depressive disorder, single episode, mild: Secondary | ICD-10-CM

## 2024-01-25 DIAGNOSIS — F5104 Psychophysiologic insomnia: Secondary | ICD-10-CM

## 2024-01-25 DIAGNOSIS — F4320 Adjustment disorder, unspecified: Secondary | ICD-10-CM

## 2024-01-25 MED ORDER — HYDROXYZINE HCL 10 MG PO TABS: 10.0000 mg | ORAL_TABLET | Freq: Every day | ORAL | 0 refills | Status: DC | PRN

## 2024-01-25 NOTE — Assessment & Plan Note (Signed)
 Chronic, improved control of insomnia with trazodone  and addition of SSRI.

## 2024-01-25 NOTE — Assessment & Plan Note (Signed)
 Chronic, significant improvement with initiation of sertraline  50 mg p.o. daily.  He has had approximately 80% improvement in his mood overall. Recommend increased frequency of counseling and healthy lifestyle.

## 2024-01-25 NOTE — Assessment & Plan Note (Signed)
 Provided with a prescription for hydroxyzine  10 mg to use as needed for anxiety associated with her his brother-in-law's difficult situation. He will use this as needed daily.

## 2024-01-25 NOTE — Progress Notes (Signed)
 Patient ID: Joseph Lam, male    DOB: [birth year], 58 y.o.   MRN: [id number]  This visit was conducted in person.  BP (!) 140/90   Pulse 80   Temp 98 F (36.7 C) (Temporal)   Ht 5' 8.5 (1.74 m)   Wt 165 lb 2 oz (74.9 kg)   SpO2 95%   BMI 24.74 kg/m    CC: Chief Complaint  Patient presents with   Depression    Follow up    Subjective:   HPI: Joseph Lam is a 58 y.o. male presenting on 01/25/2024 for follow up mood  MDD, grief reaction:  At last OV 4 week ago started on sertraline  50 mg daily  Has had some SE of fatigue... but  this SE has improved. He feels mood is better overall.. level mood. He feels calmer.  Less irritable. He feel 80% improvement overall. Chronic insomnia... trazodone  150-200 mg. Sleep was better I overall.  Continuing  therapy.SABRA 3-4 times a month.  Increased stress in life, brother in law in hospice.    12/27/2023   11:23 AM  GAD 7 : Generalized Anxiety Score  Nervous, Anxious, on Edge 1  Control/stop worrying 1  Worry too much - different things 1  Trouble relaxing 1  Restless 0  Easily annoyed or irritable 1  Afraid - awful might happen 1  Total GAD 7 Score 6  Anxiety Difficulty Somewhat difficult       12/27/2023   11:19 AM 09/14/2022   11:23 AM 03/10/2022   12:30 PM  PHQ9 SCORE ONLY  PHQ-9 Total Score 7 1 17     Relevant past medical, surgical, family and social history reviewed and updated as indicated. Interim medical history since our last visit reviewed. Allergies and medications reviewed and updated. Outpatient Medications Prior to Visit  Medication Sig Dispense Refill   acetaminophen  (TYLENOL ) 500 MG tablet Take 1,000 mg by mouth in the morning and at bedtime.     B Complex-Biotin-FA (B-COMPLEX PO) Take 1 tablet by mouth daily.     Blood Glucose Monitoring Suppl (ONE TOUCH ULTRA 2) w/Device KIT CHECK BLOOD SUGAR DAILY  0   glucose blood (ONETOUCH ULTRA) test strip Use as instructed 100 each 3   meloxicam  (MOBIC ) 15 MG  tablet Take 1 tablet (15 mg total) by mouth daily. 90 tablet 1   metFORMIN  (GLUCOPHAGE -XR) 500 MG 24 hr tablet Take 1 tablet (500 mg total) by mouth daily with breakfast. 90 tablet 3   Microlet Lancets MISC Check blood sugar 1-2 times daily and as directed. 250.00 100 each 11   Multiple Vitamins-Minerals (MULTIVITAMIN WITH MINERALS) tablet Take 1 tablet by mouth at bedtime.     Omeprazole Magnesium (PRILOSEC OTC PO) Take 20 mg by mouth daily as needed.     sertraline  (ZOLOFT ) 50 MG tablet TAKE 1 TABLET BY MOUTH EVERY DAY 90 tablet 0   sitaGLIPtin  (JANUVIA ) 100 MG tablet Take 1 tablet (100 mg total) by mouth daily. 90 tablet 3   traZODone  (DESYREL ) 50 MG tablet TAKE 1/2 TO 1 TABLET (25-50 MG TOTAL) BY MOUTH AT BEDTIME AS NEEDED FOR SLEEP. 90 tablet 1   triamcinolone  cream (KENALOG ) 0.5 % Apply 1 Application topically 2 (two) times daily. 30 g 0   VITAMIN D PO Take 1 tablet by mouth daily.     atorvastatin  (LIPITOR) 20 MG tablet Take 1 tablet (20 mg total) by mouth daily. (Patient not taking: Reported on 01/25/2024) 90 tablet 3  aspirin 81 MG tablet Take 81 mg by mouth daily. (Patient not taking: Reported on 12/27/2023)     Choline Fenofibrate  (FENOFIBRIC ACID) 135 MG CPDR Take 1 tablet by mouth daily. (Patient not taking: Reported on 12/27/2023) 90 capsule 3   gabapentin  (NEURONTIN ) 300 MG capsule TAKE 1 CAPSULE BY MOUTH THREE TIMES A DAY 270 capsule 0   No facility-administered medications prior to visit.     Per HPI unless specifically indicated in ROS section below Review of Systems  Constitutional:  Negative for fatigue and fever.  HENT:  Negative for ear pain.   Eyes:  Negative for pain.  Respiratory:  Negative for cough and shortness of breath.   Cardiovascular:  Negative for chest pain, palpitations and leg swelling.  Gastrointestinal:  Negative for abdominal pain.  Genitourinary:  Negative for dysuria.  Musculoskeletal:  Negative for arthralgias.  Neurological:  Negative for syncope,  light-headedness and headaches.  Psychiatric/Behavioral:  Negative for dysphoric mood.    Objective:  BP (!) 140/90   Pulse 80   Temp 98 F (36.7 C) (Temporal)   Ht 5' 8.5 (1.74 m)   Wt 165 lb 2 oz (74.9 kg)   SpO2 95%   BMI 24.74 kg/m   Wt Readings from Last 3 Encounters:  01/25/24 165 lb 2 oz (74.9 kg)  12/27/23 169 lb 6.4 oz (76.8 kg)  06/26/23 157 lb (71.2 kg)      Physical Exam Constitutional:      General: He is not in acute distress.    Appearance: Normal appearance. He is well-developed. He is not ill-appearing or toxic-appearing.  HENT:     Head: Normocephalic and atraumatic.     Right Ear: Hearing, tympanic membrane, ear canal and external ear normal.     Left Ear: Hearing, tympanic membrane, ear canal and external ear normal.     Nose: Nose normal.     Mouth/Throat:     Pharynx: Uvula midline.  Eyes:     General: Lids are normal. Lids are everted, no foreign bodies appreciated.     Conjunctiva/sclera: Conjunctivae normal.     Pupils: Pupils are equal, round, and reactive to light.  Neck:     Thyroid: No thyroid mass or thyromegaly.     Vascular: No carotid bruit.     Trachea: Trachea and phonation normal.  Cardiovascular:     Rate and Rhythm: Normal rate and regular rhythm.     Pulses: Normal pulses.     Heart sounds: S1 normal and S2 normal. No murmur heard.    No gallop.  Pulmonary:     Breath sounds: Normal breath sounds. No wheezing, rhonchi or rales.  Abdominal:     General: Bowel sounds are normal.     Palpations: Abdomen is soft.     Tenderness: There is no abdominal tenderness. There is no guarding or rebound.     Hernia: No hernia is present.  Musculoskeletal:     Cervical back: Normal range of motion and neck supple.  Lymphadenopathy:     Cervical: No cervical adenopathy.  Skin:    General: Skin is warm and dry.     Findings: No rash.  Neurological:     Mental Status: He is alert.     Cranial Nerves: No cranial nerve deficit.      Sensory: No sensory deficit.     Gait: Gait normal.     Deep Tendon Reflexes: Reflexes are normal and symmetric.  Psychiatric:  Speech: Speech normal.        Behavior: Behavior normal.        Judgment: Judgment normal.        Results for orders placed or performed in visit on 12/27/23  HM DIABETES FOOT EXAM   Collection Time: 12/27/23 12:00 AM  Result Value Ref Range   HM Diabetic Foot Exam done   POC HgB A1c   Collection Time: 12/27/23 11:21 AM  Result Value Ref Range   Hemoglobin A1C 6.0 (A) 4.0 - 5.6 %   HbA1c POC (<> result, manual entry)     HbA1c, POC (prediabetic range)     HbA1c, POC (controlled diabetic range)      This visit occurred during the SARS-CoV-2 public health emergency.  Safety protocols were in place, including screening questions prior to the visit, additional usage of staff PPE, and extensive cleaning of exam room while observing appropriate contact time as indicated for disinfecting solutions.   COVID 19 screen:  No recent travel or known exposure to COVID19 The patient denies respiratory symptoms of COVID 19 at this time. The importance of social distancing was discussed today.   Assessment and Plan Problem List Items Addressed This Visit     Chronic insomnia   Chronic, improved control of insomnia with trazodone  and addition of SSRI.      Grief reaction   Provided with a prescription for hydroxyzine  10 mg to use as needed for anxiety associated with her his brother-in-law's difficult situation. He will use this as needed daily.      MDD (major depressive disorder), single episode - Primary   Chronic, significant improvement with initiation of sertraline  50 mg p.o. daily.  He has had approximately 80% improvement in his mood overall. Recommend increased frequency of counseling and healthy lifestyle.      Relevant Medications   hydrOXYzine  (ATARAX ) 10 MG tablet        Greig Ring, MD

## 2024-02-04 ENCOUNTER — Other Ambulatory Visit: Payer: Self-pay | Admitting: Family Medicine

## 2024-02-04 NOTE — Telephone Encounter (Unsigned)
 Copied from CRM [id number]. Topic: Clinical - Medication Question >> Feb 04, 2024  4:33 PM Mia F wrote: Reason for CRM: Pt says last tim he spoke with Dr Avelina she told him that he can go up on the dosage on the sertraline  (ZOLOFT ) 50 MG tablet and he would like to do that now. If can be done, pt would like to use [facility] [address], [place] - [address] [place] [address] Phone: [phone] Fax: [phone] Hours: Not open 24 hours  Pt is not out of his current rx right now. Pt can be reached via phone regarding this medication request

## 2024-02-05 MED ORDER — SERTRALINE HCL 100 MG PO TABS: 100.0000 mg | ORAL_TABLET | Freq: Every day | ORAL | 3 refills | Status: DC

## 2024-02-05 NOTE — Telephone Encounter (Signed)
 I have sent a prescription to his pharmacy [facility] [address] for the higher dose of Zoloft .  He can use up what he has by taking 2 tablets of 50 mg daily.  Have him make a follow-up appointment for reevaluation of mood in 4 weeks.

## 2024-02-26 ENCOUNTER — Other Ambulatory Visit: Payer: Self-pay | Admitting: Family Medicine

## 2024-02-26 NOTE — Telephone Encounter (Signed)
 Last office visit 01/25/2024 for Depression.  Last refilled 01/25/24 for #30 with no refills.  Next appt: CPE 07/25/2024.

## 2024-03-11 ENCOUNTER — Other Ambulatory Visit: Payer: Self-pay | Admitting: Family Medicine

## 2024-04-16 ENCOUNTER — Other Ambulatory Visit: Payer: Self-pay | Admitting: Family Medicine

## 2024-06-18 ENCOUNTER — Other Ambulatory Visit: Payer: Self-pay | Admitting: Family Medicine

## 2024-07-03 ENCOUNTER — Telehealth: Payer: Self-pay | Admitting: *Deleted

## 2024-07-03 DIAGNOSIS — E1169 Type 2 diabetes mellitus with other specified complication: Secondary | ICD-10-CM

## 2024-07-03 DIAGNOSIS — Z125 Encounter for screening for malignant neoplasm of prostate: Secondary | ICD-10-CM

## 2024-07-03 DIAGNOSIS — E119 Type 2 diabetes mellitus without complications: Secondary | ICD-10-CM

## 2024-07-03 NOTE — Telephone Encounter (Signed)
-----   Message from Veva JINNY Ferrari sent at 07/03/2024 12:13 PM EST ----- Regarding: Lab orders for Fri, 1.2.26 Patient is scheduled for CPX labs, please order future labs, Thanks , Veva

## 2024-07-18 ENCOUNTER — Other Ambulatory Visit

## 2024-07-25 ENCOUNTER — Encounter: Admitting: Family Medicine
